# Patient Record
Sex: Male | Born: 1945 | Race: White | Hispanic: No | Marital: Married | Smoking: Former smoker
Health system: Southern US, Community
[De-identification: ages and names within clinical notes are randomized; demographics above are authoritative.]

## PROBLEM LIST (undated history)

## (undated) DIAGNOSIS — E119 Type 2 diabetes mellitus without complications: Secondary | ICD-10-CM

## (undated) DIAGNOSIS — G5603 Carpal tunnel syndrome, bilateral upper limbs: Principal | ICD-10-CM

## (undated) DIAGNOSIS — I10 Essential (primary) hypertension: Secondary | ICD-10-CM

## (undated) DIAGNOSIS — R413 Other amnesia: Secondary | ICD-10-CM

## (undated) DIAGNOSIS — E669 Obesity, unspecified: Secondary | ICD-10-CM

## (undated) DIAGNOSIS — I2510 Atherosclerotic heart disease of native coronary artery without angina pectoris: Secondary | ICD-10-CM

## (undated) DIAGNOSIS — G609 Hereditary and idiopathic neuropathy, unspecified: Secondary | ICD-10-CM

## (undated) DIAGNOSIS — D649 Anemia, unspecified: Secondary | ICD-10-CM

## (undated) DIAGNOSIS — F0280 Dementia in other diseases classified elsewhere without behavioral disturbance: Secondary | ICD-10-CM

## (undated) DIAGNOSIS — K219 Gastro-esophageal reflux disease without esophagitis: Secondary | ICD-10-CM

## (undated) DIAGNOSIS — E1149 Type 2 diabetes mellitus with other diabetic neurological complication: Secondary | ICD-10-CM

## (undated) DIAGNOSIS — G309 Alzheimer's disease, unspecified: Secondary | ICD-10-CM

## (undated) DIAGNOSIS — E538 Deficiency of other specified B group vitamins: Secondary | ICD-10-CM

## (undated) DIAGNOSIS — R209 Unspecified disturbances of skin sensation: Secondary | ICD-10-CM

## (undated) DIAGNOSIS — K635 Polyp of colon: Secondary | ICD-10-CM

## (undated) HISTORY — DX: Unspecified disturbances of skin sensation: R20.9

## (undated) HISTORY — DX: Gastro-esophageal reflux disease without esophagitis: K21.9

## (undated) HISTORY — DX: Type 2 diabetes mellitus without complications: E11.9

## (undated) HISTORY — DX: Carpal tunnel syndrome, bilateral upper limbs: G56.03

## (undated) HISTORY — DX: Alzheimer's disease, unspecified: G30.9

## (undated) HISTORY — DX: Deficiency of other specified B group vitamins: E53.8

## (undated) HISTORY — DX: Type 2 diabetes mellitus with other diabetic neurological complication: E11.49

## (undated) HISTORY — DX: Obesity, unspecified: E66.9

## (undated) HISTORY — DX: Other amnesia: R41.3

## (undated) HISTORY — DX: Dementia in other diseases classified elsewhere without behavioral disturbance: F02.80

## (undated) HISTORY — DX: Hereditary and idiopathic neuropathy, unspecified: G60.9

## (undated) HISTORY — DX: Atherosclerotic heart disease of native coronary artery without angina pectoris: I25.10

## (undated) HISTORY — PX: COLONOSCOPY: SHX174

---

## 2003-07-17 HISTORY — PX: OTHER SURGICAL HISTORY: SHX169

## 2008-01-07 ENCOUNTER — Ambulatory Visit: Payer: Self-pay | Admitting: Thoracic Surgery (Cardiothoracic Vascular Surgery)

## 2008-01-15 ENCOUNTER — Ambulatory Visit: Payer: Self-pay | Admitting: Cardiothoracic Surgery

## 2008-01-22 ENCOUNTER — Ambulatory Visit: Payer: Self-pay | Admitting: Cardiothoracic Surgery

## 2008-01-29 ENCOUNTER — Ambulatory Visit: Payer: Self-pay | Admitting: Thoracic Surgery (Cardiothoracic Vascular Surgery)

## 2008-02-02 ENCOUNTER — Ambulatory Visit: Payer: Self-pay | Admitting: Thoracic Surgery (Cardiothoracic Vascular Surgery)

## 2008-02-05 ENCOUNTER — Ambulatory Visit: Payer: Self-pay | Admitting: Thoracic Surgery (Cardiothoracic Vascular Surgery)

## 2008-02-11 ENCOUNTER — Ambulatory Visit: Payer: Self-pay | Admitting: Thoracic Surgery (Cardiothoracic Vascular Surgery)

## 2008-02-25 ENCOUNTER — Ambulatory Visit: Payer: Self-pay | Admitting: Thoracic Surgery (Cardiothoracic Vascular Surgery)

## 2011-03-13 ENCOUNTER — Other Ambulatory Visit: Payer: Self-pay | Admitting: Orthopedic Surgery

## 2011-03-13 DIAGNOSIS — M5412 Radiculopathy, cervical region: Secondary | ICD-10-CM

## 2011-03-17 ENCOUNTER — Ambulatory Visit
Admission: RE | Admit: 2011-03-17 | Discharge: 2011-03-17 | Disposition: A | Discharge status: Home / Self Care | Payer: Medicare Other | Source: Ambulatory Visit | Attending: Orthopedic Surgery | Admitting: Orthopedic Surgery

## 2011-03-17 DIAGNOSIS — M5412 Radiculopathy, cervical region: Secondary | ICD-10-CM

## 2011-07-17 HISTORY — PX: OTHER SURGICAL HISTORY: SHX169

## 2013-04-20 ENCOUNTER — Encounter: Payer: Self-pay | Admitting: Neurology

## 2013-04-22 ENCOUNTER — Ambulatory Visit (INDEPENDENT_AMBULATORY_CARE_PROVIDER_SITE_OTHER): Payer: Medicare Other | Admitting: Neurology

## 2013-04-22 ENCOUNTER — Encounter: Payer: Self-pay | Admitting: Neurology

## 2013-04-22 VITALS — BP 154/72 | HR 66 | Ht 66.0 in | Wt 194.0 lb

## 2013-04-22 DIAGNOSIS — R6889 Other general symptoms and signs: Secondary | ICD-10-CM

## 2013-04-22 DIAGNOSIS — R413 Other amnesia: Secondary | ICD-10-CM

## 2013-04-22 HISTORY — DX: Other amnesia: R41.3

## 2013-04-22 MED ORDER — DONEPEZIL HCL 5 MG PO TABS: 5.0000 mg | ORAL_TABLET | Freq: Every day | ORAL | Status: DC

## 2013-04-22 NOTE — Patient Instructions (Signed)
With the aricept, look out for nausea, diarrhea, weight loss. Call in 4 weeks if tolerating, and we will go up to 10 mg daily dose(start at 5 mg)

## 2013-04-22 NOTE — Progress Notes (Signed)
Reason for visit: Memory disturbance  Gregory Ortega is a 67 y.o. male  History of present illness:  Gregory Ortega is a 67 year old left-handed white male with a history of a progressive memory disturbance dating back 4 years. The patient was initially placed on a statin drug, simvastatin, for dyslipidemia. The patient began having significant cognitive issues, and he initially improved when he came off of the medication 2 years ago. The patient however, has continued to get worse once again within the last year. The patient did have a CT scan of the brain done in 2011 that was relatively unremarkable. The patient has not had any brain imaging since that time. The patient has had some issues with keeping up with appointments and medications. The patient does not do the finances, and his wife took over this many years ago. The patient denies any headache, or significant balance issues. The patient denies problems with numbness or weakness of the extremities, or problems with control of the bowels or the bladder. The patient does have some mild fatigue associated with his blood pressure medications. Otherwise, the patient sleeps well at night. The patient is sent to this office for an evaluation. The patient has a history of diabetes, but he denies episodes of hypoglycemia. The patient has a history of a vitamin B12 deficiency, but he is now on adequate oral supplementation, recent blood levels were well within the normal range.   Past Medical History  Diagnosis Date  . Type II or unspecified type diabetes mellitus without mention of complication, not stated as uncontrolled   . Coronary atherosclerosis of unspecified type of vessel, native or graft   . Type II or unspecified type diabetes mellitus with neurological manifestations, not stated as uncontrolled(250.60)   . Esophageal reflux   . Obesity, unspecified   . Unspecified hereditary and idiopathic peripheral neuropathy   . Disturbance of skin  sensation   . Other B-complex deficiencies   . Memory difficulty 04/22/2013    Past Surgical History  Procedure Laterality Date  . By-pass surgery  2005  . Moles removal  2013    Family History  Problem Relation Age of Onset  . Hypertension Mother   . Alzheimer's disease Mother   . Dementia Mother   . Cancer Brother   . Heart attack Brother   . Kidney disease Father   . Alzheimer's disease Father   . Rheum arthritis Sister     Social history:  reports that he has quit smoking. He does not have any smokeless tobacco history on file. He reports that he does not drink alcohol or use illicit drugs.  Medications:  Current Outpatient Prescriptions on File Prior to Visit  Medication Sig Dispense Refill  . aspirin 81 MG tablet Take 81 mg by mouth daily.      . carvedilol (COREG) 12.5 MG tablet Take 12.5 mg by mouth 2 (two) times daily with a meal.      . clopidogrel (PLAVIX) 75 MG tablet Take 75 mg by mouth daily.      Marland Kitchen co-enzyme Q-10 50 MG capsule Take 100 mg by mouth 2 (two) times a week.      . docusate sodium (COLACE) 100 MG capsule Take 100 mg by mouth 2 (two) times daily.      . famotidine (PEPCID) 20 MG tablet Take 20 mg by mouth 2 (two) times daily.      . fish oil-omega-3 fatty acids 1000 MG capsule Take 2 g by mouth daily.      Marland Kitchen  magnesium 30 MG tablet Take 30 mg by mouth 2 (two) times daily.      . metFORMIN (GLUCOPHAGE-XR) 500 MG 24 hr tablet Take 1,000 mg by mouth 2 (two) times daily.      . nitroGLYCERIN (NITROSTAT) 0.4 MG SL tablet Place 0.4 mg under the tongue every 5 (five) minutes as needed for chest pain.       No current facility-administered medications on file prior to visit.      Allergies  Allergen Reactions  . Ambien [Zolpidem Tartrate] Other (See Comments)    irritability  . Statins     ROS:  Out of a complete 14 system review of symptoms, the patient complains only of the following symptoms, and all other reviewed systems are  negative.  Moles Shortness of breath, snoring Impotence Easy bruising, easy bleeding Feeling cold Memory loss  Blood pressure 154/72, pulse 66, height 5\' 6"  (1.676 m), weight 194 lb (87.998 kg).  Physical Exam  General: The patient is alert and cooperative at the time of the examination.  Head: Pupils are equal, round, and reactive to light. Discs are flat bilaterally.  Neck: The neck is supple, no carotid bruits are noted.  Respiratory: The respiratory examination is clear.  Cardiovascular: The cardiovascular examination reveals a regular rate and rhythm, no obvious murmurs or rubs are noted.  Skin: Extremities are without significant edema.  Neurologic Exam  Mental status: Mini-Mental status examination done today shows a total score of 15/30.  Cranial nerves: Facial symmetry is present. There is good sensation of the face to pinprick and soft touch bilaterally. The strength of the facial muscles and the muscles to head turning and shoulder shrug are normal bilaterally. Speech is well enunciated, no aphasia or dysarthria is noted. Extraocular movements are full. Visual fields are full.  Motor: The motor testing reveals 5 over 5 strength of all 4 extremities. Good symmetric motor tone is noted throughout.  Sensory: Sensory testing is intact to pinprick, soft touch, vibration sensation, and position sense on all 4 extremities. No evidence of extinction is noted.  Coordination: Cerebellar testing reveals good finger-nose-finger and heel-to-shin bilaterally. apraxia with the use of the lower extremities is seen.  Gait and station: Gait is normal. Tandem gait is normal. Romberg is negative. No drift is seen.  Reflexes: Deep tendon reflexes are symmetric and normal bilaterally. Toes are downgoing bilaterally.   Assessment/Plan:  1. Progressive memory disturbance  2. 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 History of vitamin B12 deficiency  The patient does have a  family history of memory issues, and his mother had dementia. The patient will be sent for further blood work today, and he will have MRI evaluation of the brain. The patient will be placed on low-dose Aricept, and he will contact our office within one month if he is tolerating the drug, and he will be increased to a 10 mg maintenance dose of this medication. The patient will followup in 6 months. The statin drugs are not likely the sole etiology of his cognitive issues. 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  Marlan Palau MD 04/22/2013 7:37 PM  [facility] [address] [place], [place] [address]  Phone [phone] Fax [phone]

## 2013-04-23 LAB — RPR: RPR: NONREACTIVE

## 2013-04-23 NOTE — Progress Notes (Signed)
Quick Note:  Spoke to patient's wife and relayed unremarkable blood work results, per Dr. Anne Hahn. ______

## 2013-05-08 ENCOUNTER — Ambulatory Visit
Admission: RE | Admit: 2013-05-08 | Discharge: 2013-05-08 | Disposition: A | Discharge status: Home / Self Care | Payer: Medicare Other | Source: Ambulatory Visit | Attending: Neurology | Admitting: Neurology

## 2013-05-08 DIAGNOSIS — R413 Other amnesia: Secondary | ICD-10-CM

## 2013-05-09 ENCOUNTER — Telehealth: Payer: Self-pay | Admitting: Neurology

## 2013-05-09 NOTE — Telephone Encounter (Signed)
I called patient. MRI the brain shows minimal small vessel disease. The patient is on low-dose aspirin, he has several risk factors for stroke. The patient will followup in about 6 months.

## 2013-06-21 ENCOUNTER — Other Ambulatory Visit: Payer: Self-pay

## 2013-06-21 MED ORDER — DONEPEZIL HCL 10 MG PO TABS: 10.0000 mg | ORAL_TABLET | Freq: Every day | ORAL | Status: DC

## 2013-06-23 ENCOUNTER — Telehealth: Payer: Self-pay | Admitting: Neurology

## 2013-06-23 NOTE — Telephone Encounter (Signed)
I spoke with spouse.  She said patient has tolerated 5 mg well.  Says she knows at last OV it was mentioned he could go to 10 mg if tolerated.  She said she would like to wait on the increase until after the holidays incase there are any issues.  A Rx for 10 mg has been sent to the pharmacy for them to fill when needed.  She said patient has tablets at this time and they will call back if there are any issues.

## 2013-07-03 ENCOUNTER — Telehealth: Payer: Self-pay | Admitting: *Deleted

## 2013-07-03 NOTE — Telephone Encounter (Signed)
I called patient. The patient was recently increased on Aricept taking 10 mg at night. He was tolerating the 5 mg fairly well. We will have him cut back to 5 mg at night instead.

## 2013-10-21 ENCOUNTER — Ambulatory Visit: Payer: Medicare Other | Admitting: Nurse Practitioner

## 2013-11-11 ENCOUNTER — Ambulatory Visit (INDEPENDENT_AMBULATORY_CARE_PROVIDER_SITE_OTHER): Payer: Medicare Other | Admitting: Neurology

## 2013-11-11 ENCOUNTER — Encounter: Payer: Self-pay | Admitting: Neurology

## 2013-11-11 ENCOUNTER — Encounter (INDEPENDENT_AMBULATORY_CARE_PROVIDER_SITE_OTHER): Payer: Self-pay

## 2013-11-11 VITALS — BP 156/76 | HR 61 | Wt 201.0 lb

## 2013-11-11 DIAGNOSIS — R413 Other amnesia: Secondary | ICD-10-CM

## 2013-11-11 MED ORDER — MEMANTINE HCL 28 X 5 MG & 21 X 10 MG PO TABS: ORAL_TABLET | ORAL | Status: DC

## 2013-11-11 NOTE — Patient Instructions (Addendum)
Dementia Dementia is a word that is used to describe problems with the brain and how it works. People with dementia have memory loss. They may also have problems with thinking, speaking, or solving problems. It can affect how they act around people, how they do their job, their mood, and their personality. These changes may not show up for a long time. Family or friends may not notice problems in the early part of this disease. HOME CARE The following tips are for the person living with, or caring for, the person with dementia. Make the home safe.  Remove locks on bathroom doors.  Use childproof locks on cabinets where alcohol, cleaning supplies, or chemicals are stored.  Put outlet covers in electrical outlets.  Put in childproof locks to keep doors and windows safe.  Remove stove knobs, or put in safety knobs that shut off on their own.  Lower the temperature on water heaters.  Label medicines. Lock them in a safe place.  Keep knives, lighters, matches, power tools, and guns out of reach or in a safe place.  Remove objects that might break or can hurt the person.  Make sure lighting is good inside and outside.  Put in grab bars if needed.  Use a device that detects falls or other needs for help. Lessen confusion.  Keep familiar objects and people around.  Use night lights or low lit (dim) lights at night.  Label objects or areas.  Use reminders, notes, or directions for daily activities or tasks.  Keep a simple routine that is the same for waking, meals, bathing, dressing, and bedtime.  Create a calm and quiet home.  Put up clocks and calendars.  Keep emergency numbers and the home address near all phones.  Help show the different times of day. Open the curtains during the day to let light in. Speak clearly and directly.  Choose simple words and short sentences.  Use a gentle, calm voice.  Do not interrupt.  If the person has a hard time finding a word to  use, give them the word or thought.  Ask 1 question at a time. Give enough time for the person to answer. Repeat the question if the person does not answer. Do things that lessen restlessness.  Provide a comfortable bed.  Have the same bedtime routine every night.  Have a regular walking and activity schedule.  Lessen naps during the day.  Do not let the person drink a lot of caffeine.  Go to events that are not overwhelming. Eat well and drink fluids.  Lessen distractions during meal times and snacks.  Avoid foods that are too hot or too cold.  Watch how the person chews and swallows. This is to make sure they do not choke. Other  Keep all vision, hearing, dental, and medical visits with the doctor.  Only give medicines as told by the doctor.  Watch the person's driving ability. Do not let the person drive if he or she cannot drive safely.  Use a program that helps find a person if they become missing. You may need to register with this program. GET HELP RIGHT AWAY IF:   A fever of 102 F (38.9 C) develops.  Confusion develops or gets worse.  Sleepiness develops or gets worse.  Staying awake is hard to do.  New behavior problems start like mood swings, aggression, and seeing things that are not there.  Problems with balance, speech, or falling develop.  Problems swallowing develop.  Any  problems of another sickness develop. MAKE SURE YOU:  Understand these instructions.  Will watch his or her condition.  Will get help right away if he or she is not doing well or gets worse. Document Released: 06/14/2008 Document Revised: 09/24/2011 Document Reviewed: 11/27/2010 [facility] Patient Information 2014 [employer], [place]. Memantine Tablets What is this medicine? MEMANTINE (MEM an teen) is used to treat dementia caused by Alzheimer's disease. This medicine may be used for other purposes; ask your health care provider or pharmacist if you have questions. COMMON  BRAND NAME(S): Namenda What should I tell my health care provider before I take this medicine? They need to know if you have any of these conditions: -difficulty passing urine -kidney disease -liver disease -seizures -an unusual or allergic reaction to memantine, other medicines, foods, dyes, or preservatives -pregnant or trying to get pregnant -breast-feeding How should I use this medicine? Take this medicine by mouth with a glass of water. Follow the directions on the prescription label. You may take this medicine with or without food. Take your doses at regular intervals. Do not take your medicine more often than directed. Continue to take your medicine even if you feel better. Do not stop taking except on the advice of your doctor or health care professional. Talk to your pediatrician regarding the use of this medicine in children. Special care may be needed. Overdosage: If you think you have taken too much of this medicine contact a poison control center or emergency room at once. NOTE: This medicine is only for you. Do not share this medicine with others. What if I miss a dose? If you miss a dose, take it as soon as you can. If it is almost time for your next dose, take only that dose. Do not take double or extra doses. If you do not take your medicine for several days, contact your health care provider. Your dose may need to be changed. What may interact with this medicine? -acetazolamide -amantadine -cimetidine -dextromethorphan -dofetilide -hydrochlorothiazide -ketamine -metformin -methazolamide -quinidine -ranitidine -sodium bicarbonate -triamterene This list may not describe all possible interactions. Give your health care provider a list of all the medicines, herbs, non-prescription drugs, or dietary supplements you use. Also tell them if you smoke, drink alcohol, or use illegal drugs. Some items may interact with your medicine. What should I watch for while using this  medicine? Visit your doctor or health care professional for regular checks on your progress. Check with your doctor or health care professional if there is no improvement in your symptoms or if they get worse. You may get drowsy or dizzy. Do not drive, use machinery, or do anything that needs mental alertness until you know how this drug affects you. Do not stand or sit up quickly, especially if you are an older patient. This reduces the risk of dizzy or fainting spells. Alcohol can make you more drowsy and dizzy. Avoid alcoholic drinks. What side effects may I notice from receiving this medicine? Side effects that you should report to your doctor or health care professional as soon as possible: -allergic reactions like skin rash, itching or hives, swelling of the face, lips, or tongue -agitation or a feeling of restlessness -depressed mood -dizziness -hallucinations -redness, blistering, peeling or loosening of the skin, including inside the mouth -seizures -vomiting Side effects that usually do not require medical attention (report to your doctor or health care professional if they continue or are bothersome): -constipation -diarrhea -headache -nausea -trouble sleeping This list may not  describe all possible side effects. Call your doctor for medical advice about side effects. You may report side effects to FDA at 1-800-FDA-1088. Where should I keep my medicine? Keep out of the reach of children. Store at room temperature between 15 degrees and 30 degrees C (59 degrees and 86 degrees F). Throw away any unused medicine after the expiration date. NOTE: This sheet is a summary. It may not cover all possible information. If you have questions about this medicine, talk to your doctor, pharmacist, or health care provider.  2014, Elsevier/Gold Standard. (2013-04-20 14:10:42)

## 2013-11-11 NOTE — Progress Notes (Signed)
PATIENT: Gregory Ortega DOB: 1946-03-01  REASON FOR VISIT: follow up HISTORY FROM: patient  HISTORY OF PRESENT ILLNESS: Gregory Ortega is a 68 year old left-handed white male with a history of a progressive memory disturbance. He returns today for follow-up. At the last visit he was started on Aricept 5mg  and was increased to 10mg  one month later but never took the 10 mg only took 5mg . Wife states that he became verbally aggressive. States he raised his voice with her and got in her face, however he has never hurt her. They stopped the Aricept and the aggressive behavior resolved. The wife feels like he has gotten worse within the last 2 weeks. The patient was started on two new medications Ranexa and Zetia. Wife notices that the change in his cognition happened after he started these medications. The patient feels that some days his memory is good and other times it is bad. He continues to operate a motor vehicle, has had some issues with knowing whether or not to go or stop. The patient continues to mow the yard, eat out and go to church. Wife states that he carries on conversations with people very well. He will not remember what his wife said two minutes prior or will forget what objects are used for. When dressing sometimes he will put his shirt on backwards or can't tie his tie. When signings checks he will forget how to sign his name.   REVIEW OF SYSTEMS: Full 14 system review of systems performed and notable only for:  Constitutional: N/A  Eyes: N/A Ear/Nose/Throat: N/A  Skin: moles  Cardiovascular: murmur  Respiratory: cough, shortness of breath Gastrointestinal: N/A  Genitourinary: N/A Hematology/Lymphatic: bruise/bleed easily Endocrine: cold intolerance Musculoskeletal:N/A  Allergy/Immunology: N/A  Neurological: memory loss Psychiatric: confusion  Sleep: N/A   ALLERGIES: Allergies  Allergen Reactions  . Ambien [Zolpidem Tartrate] Other (See Comments)    irritability  .  Statins     HOME MEDICATIONS: Outpatient Prescriptions Prior to Visit  Medication Sig Dispense Refill  . ALPHA LIPOIC ACID PO Take 1 mg by mouth daily.      Marland Kitchen aspirin 81 MG tablet Take 81 mg by mouth daily.      . carvedilol (COREG) 12.5 MG tablet Take 12.5 mg by mouth 2 (two) times daily with a meal.      . Chromium 200 MCG TABS Take 200 mcg by mouth daily.      . clopidogrel (PLAVIX) 75 MG tablet Take 75 mg by mouth daily.      Marland Kitchen co-enzyme Q-10 50 MG capsule Take 100 mg by mouth 2 (two) times a week.      . Cyanocobalamin (HM SUPER VITAMIN B12) 2500 MCG CHEW Chew 2,500 mcg by mouth daily.      Marland Kitchen docusate sodium (COLACE) 100 MG capsule Take 100 mg by mouth 2 (two) times daily.      Marland Kitchen donepezil (ARICEPT) 10 MG tablet Take 1 tablet (10 mg total) by mouth at bedtime.  30 tablet  4  . famotidine (PEPCID) 20 MG tablet Take 20 mg by mouth 2 (two) times daily.      . fish oil-omega-3 fatty acids 1000 MG capsule Take 2 g by mouth daily.      . magnesium 30 MG tablet Take 30 mg by mouth 2 (two) times daily.      . metFORMIN (GLUCOPHAGE-XR) 500 MG 24 hr tablet Take 1,000 mg by mouth 2 (two) times daily.      . Multiple  Vitamins-Minerals (MULTIVITAMIN WITH MINERALS) tablet Take 1 tablet by mouth daily.      . nitroGLYCERIN (NITROSTAT) 0.4 MG SL tablet Place 0.4 mg under the tongue every 5 (five) minutes as needed for chest pain.      . vitamin E 200 UNIT capsule Take 200 Units by mouth daily.       No facility-administered medications prior to visit.    PAST MEDICAL HISTORY: Past Medical History  Diagnosis Date  . Type II or unspecified type diabetes mellitus without mention of complication, not stated as uncontrolled   . Coronary atherosclerosis of unspecified type of vessel, native or graft   . Type II or unspecified type diabetes mellitus with neurological manifestations, not stated as uncontrolled   . Esophageal reflux   . Obesity, unspecified   . Unspecified hereditary and idiopathic  peripheral neuropathy   . Disturbance of skin sensation   . Other B-complex deficiencies   . Memory difficulty 04/22/2013    PAST SURGICAL HISTORY: Past Surgical History  Procedure Laterality Date  . By-pass surgery  2005  . Moles removal  2013    FAMILY HISTORY: Family History  Problem Relation Age of Onset  . Hypertension Mother   . Alzheimer's disease Mother   . Dementia Mother   . Cancer Brother   . Heart attack Brother   . Kidney disease Father   . Alzheimer's disease Father   . Rheum arthritis Sister     SOCIAL HISTORY: History   Social History  . Marital Status: Married    Spouse Name: N/A    Number of Children: N/A  . Years of Education: N/A   Occupational History  . Retired    Social History Main Topics  . Smoking status: Former Games developermoker  . Smokeless tobacco: Not on file  . Alcohol Use: No  . Drug Use: No  . Sexual Activity: Not on file   Other Topics Concern  . Not on file   Social History Narrative  . No narrative on file      PHYSICAL EXAM  There were no vitals filed for this visit. There is no weight on file to calculate BMI.  Generalized: Well developed, in no acute distress    Neurological examination  Mentation: Alert oriented to time, place, history taking. Follows all commands speech and language fluent. MMSE 11/30 Cranial nerve II-XII:t extraocular movements were full, visual field were full on confrontational test.  Motor: The motor testing reveals 5 over 5 strength of all 4 extremities. Good symmetric motor tone is noted throughout.  Sensory: Sensory testing is intact to soft touch on all 4 extremities. No evidence of extinction is noted.  Coordination: Cerebellar testing reveals good finger-nose-finger and heel-to-shin bilaterally.  Gait and station: Gait is normal. Tandem gait is normal. Romberg is negative. No drift is seen.  Reflexes: Deep tendon reflexes are symmetric and normal bilaterally.   DIAGNOSTIC DATA (LABS,  IMAGING, TESTING) - I reviewed patient records, labs, notes, testing and imaging myself where available.  Lab Results  Component Value Date   TSH 1.170 04/22/2013      ASSESSMENT AND PLAN 68 y.o. year old male  has a past medical history of Type II or unspecified type diabetes mellitus without mention of complication, not stated as uncontrolled; Coronary atherosclerosis of unspecified type of vessel, native or graft; Type II or unspecified type diabetes mellitus with neurological manifestations, not stated as uncontrolled; Esophageal reflux; Obesity, unspecified; Unspecified hereditary and idiopathic peripheral neuropathy; Disturbance of skin  sensation; Other B-complex deficiencies; and Memory difficulty (04/22/2013). here with:   1. Memory difficulty  The patient's memory has declined over the last two weeks. MMSE 11/30 this visit and 15/30 last visit. The patient is advised to speak with his PCP about discontinuing the use of ranexa since confusion is a side effect of this medication. We encouraged the patient to not operate a motor vehicle. The patient was not able to tolerate Aricept. We will start the patient on Namenda. Follow-up in 6 months or sooner if needed.    Gregory PennyMegan Kensy Blizard, MSN, NP-C 11/11/2013, 3:35 PM [facility] [address], [address] [place], [place] [address] [phone]  Note: This document was prepared with digital dictation and possible smart phrase technology. Any transcriptional errors that result from this process are unintentional. Gregory Ortega

## 2013-11-12 ENCOUNTER — Telehealth: Payer: Self-pay | Admitting: *Deleted

## 2013-11-12 NOTE — Telephone Encounter (Signed)
Patient's wife calling wanting to know if they could get samples of memantine (NAMENDA TITRATION PAK) tablet pack. Please call and advise.  Thanks

## 2013-11-12 NOTE — Telephone Encounter (Signed)
An Rx was sent to the pharmacy yesterday.  I spoke with the pharmacist at [facility], she said they did not have the med in stock, but it did arrive today.  She will call the patient to advise.

## 2013-11-24 ENCOUNTER — Telehealth: Payer: Self-pay | Admitting: Neurology

## 2013-11-24 NOTE — Telephone Encounter (Signed)
I spoke with MS Kyung RuddKennedy.  She said the patient was taking Namenda 5mg  once daily and seemed to be "more like himself".  Says he started taking 5mg  twice daily yesterday, and now seems to be getting confused again.  For example, he could not remember which key belonged to the house and which key belonged to the car.  She would like to know what they should do.  Please advise.  Thank you.

## 2013-11-24 NOTE — Telephone Encounter (Signed)
Pt's wife Dewayne Hatchnn called states pt is having problems with the medication memantine (NAMENDA TITRATION PAK) tablet pack, states pt is getting confused but did ok on the lower dosage he did better than when he started taking the medication at night time. Please call Ann concerning this matter. Thanks

## 2013-11-24 NOTE — Telephone Encounter (Signed)
I called the patient, talked with the wife. The patient began Namenda 5 mg twice daily today, and has had some increased confusion. I will try this for another several days, but if the confusion continues, they are to stop the medication. We may try Axona or other related products if this is the case.

## 2013-12-09 ENCOUNTER — Telehealth: Payer: Self-pay | Admitting: Neurology

## 2013-12-09 MED ORDER — MEMANTINE HCL 10 MG PO TABS: 10.0000 mg | ORAL_TABLET | Freq: Two times a day (BID) | ORAL | Status: DC

## 2013-12-09 NOTE — Telephone Encounter (Signed)
Patients wife returning Jessicas call.  States patient is doing well on this medication now.  He is taking 10 MG twice per day.

## 2013-12-09 NOTE — Telephone Encounter (Signed)
Rx has been sent.  Called back.  Got no answer.  Left message.

## 2013-12-09 NOTE — Telephone Encounter (Signed)
Per previous note, the patient had some issues with confusion while on this medication.  I called back to see how he was doing on this drug, and verify what dose he is currently taking.  Got no answer.  Left message.

## 2013-12-09 NOTE — Telephone Encounter (Signed)
Patient's sps called requesting rx for memantine (NAMENDA TITRATION PAK) tablet pack 10 mg.  Please call and advise

## 2014-04-09 ENCOUNTER — Telehealth: Payer: Self-pay | Admitting: Neurology

## 2014-04-09 MED ORDER — MEMANTINE HCL ER 28 MG PO CP24: 28.0000 mg | ORAL_CAPSULE | Freq: Every day | ORAL | Status: DC

## 2014-04-09 NOTE — Addendum Note (Signed)
Addended by: Stephanie Acre on: 04/09/2014 05:35 PM   Modules accepted: Orders

## 2014-04-09 NOTE — Telephone Encounter (Signed)
I called and talked with the wife. [facility] is doing a study involving a NS, I encouraged her to learn more about the study.

## 2014-04-09 NOTE — Telephone Encounter (Signed)
Patient's wife Gregory Ortega calling with questions about [facility] research on Alzheimer's, if Dr Anne Hahn knows anything about that, please return call and advise.

## 2014-04-09 NOTE — Telephone Encounter (Signed)
See phone note, patient aware Dr. Jaye Beagle of office.

## 2014-04-09 NOTE — Telephone Encounter (Signed)
Informed patient spouse that Dr. Anne Hahn will be out of office until Tuesday. He will call her back to respond to her question.

## 2014-05-12 ENCOUNTER — Ambulatory Visit (INDEPENDENT_AMBULATORY_CARE_PROVIDER_SITE_OTHER): Payer: Medicare Other | Admitting: Neurology

## 2014-05-12 ENCOUNTER — Encounter: Payer: Self-pay | Admitting: Neurology

## 2014-05-12 VITALS — BP 119/72 | HR 60 | Ht 67.0 in | Wt 208.0 lb

## 2014-05-12 DIAGNOSIS — R413 Other amnesia: Secondary | ICD-10-CM

## 2014-05-12 MED ORDER — MEMANTINE HCL ER 28 MG PO CP24: 28.0000 mg | ORAL_CAPSULE | Freq: Every day | ORAL | Status: DC

## 2014-05-12 NOTE — Progress Notes (Signed)
Reason for visit: Dementia  Kathrin RuddyVirgil Aro is an 68 y.o. male  History of present illness:  Mr. Kyung RuddKennedy is a 68 year old left-handed white male with a history of Alzheimer's disease. The patient has had problems with aggression on Aricept, and this medication had to be stopped. The patient has done quite well on Namenda, currently on the 28 mg extended-release capsule. He is also taking coconut oil, and he is tolerating this well. He apparently is back to driving a car on occasion, and he apparently has not had any problems with safety issues. The patient has not had any new medical issues that have come up since last seen. The patient has been interested in being involved with research, but he did not qualify [facility] a study at [facility].  Past Medical History  Diagnosis Date  . Type II or unspecified type diabetes mellitus without mention of complication, not stated as uncontrolled   . Coronary atherosclerosis of unspecified type of vessel, native or graft   . Type II or unspecified type diabetes mellitus with neurological manifestations, not stated as uncontrolled   . Esophageal reflux   . Obesity, unspecified   . Unspecified hereditary and idiopathic peripheral neuropathy   . Disturbance of skin sensation   . Other B-complex deficiencies   . Memory difficulty 04/22/2013    Past Surgical History  Procedure Laterality Date  . By-pass surgery  2005  . Moles removal  2013    Family History  Problem Relation Age of Onset  . Hypertension Mother   . Alzheimer's disease Mother   . Dementia Mother   . Cancer Brother   . Heart attack Brother   . Kidney disease Father   . Alzheimer's disease Father   . Rheum arthritis Sister     Social history:  reports that he has quit smoking. He does not have any smokeless tobacco history on file. He reports that he does not drink alcohol or use illicit drugs.    Allergies  Allergen Reactions  . Ambien [Zolpidem Tartrate] Other (See Comments)   irritability  . Statins     Medications:  Current Outpatient Prescriptions on File Prior to Visit  Medication Sig Dispense Refill  . aspirin 81 MG tablet Take 81 mg by mouth daily.      . clopidogrel (PLAVIX) 75 MG tablet Take 75 mg by mouth daily.      Marland Kitchen. co-enzyme Q-10 50 MG capsule Take 100 mg by mouth 2 (two) times a week.      . Cyanocobalamin (HM SUPER VITAMIN B12) 2500 MCG CHEW Chew 2,500 mcg by mouth daily.      Marland Kitchen. docusate sodium (COLACE) 100 MG capsule Take 100 mg by mouth 2 (two) times daily.      . famotidine (PEPCID) 20 MG tablet Take 20 mg by mouth 2 (two) times daily.      . fish oil-omega-3 fatty acids 1000 MG capsule Take 2 g by mouth daily.      . metFORMIN (GLUCOPHAGE-XR) 500 MG 24 hr tablet Take 1,000 mg by mouth 2 (two) times daily.      . Multiple Vitamins-Minerals (MULTIVITAMIN WITH MINERALS) tablet Take 1 tablet by mouth daily.      . nitroGLYCERIN (NITROSTAT) 0.4 MG SL tablet Place 0.4 mg under the tongue every 5 (five) minutes as needed for chest pain.      . vitamin E 200 UNIT capsule Take 200 Units by mouth daily.       No current  facility-administered medications on file prior to visit.    ROS:  Out of a complete 14 system review of symptoms, the patient complains only of the following symptoms, and all other reviewed systems are negative.  Cough Bruising easily Memory loss Decreased concentration Moles  Blood pressure 119/72, pulse 60, height 5\' 7"  (1.702 m), weight 208 lb (94.348 kg).  Physical Exam  General: The patient is alert and cooperative at the time of the examination. The patient is moderately obese.  Skin: No significant peripheral edema is noted.   Neurologic Exam  Mental status: The Mini-Mental Status Examination done today shows a total score of 16/30.  Cranial nerves: Facial symmetry is present. Speech is normal, no aphasia or dysarthria is noted. Extraocular movements are full. Visual fields are full.  Motor: The patient has  good strength in all 4 extremities.  Sensory examination: Soft touch sensation is symmetric on the face, arms, and legs.  Coordination: The patient has good finger-nose-finger and heel-to-shin bilaterally. 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  Gait and station: The patient has a normal gait. Tandem gait is normal. Romberg is negative. No drift is seen.  Reflexes: Deep tendon reflexes are symmetric.   Assessment/Plan:  1. Progressive dementia, Alzheimer's disease  The patient has done better with the Namenda, and apparently the wife is now letting him drive again. I have recommended that he not operate a motor vehicle. The patient is to continue the [place], he will follow-up in about 6 months. We will be involved with an Alzheimer's research study beginning in January, 2016. I have given the name of this patient to our research coordinator.  Marlan Palau. Keith Willis MD 05/12/2014 7:17 PM  [facility] [address] [place], [place] [id number]  Phone [phone] Fax [phone]

## 2014-05-12 NOTE — Patient Instructions (Signed)
Alzheimer Disease Alzheimer disease is a mental disorder. It causes memory loss and loss of other mental functions, such as learning, thinking, problem solving, communicating, and completing tasks. The mental losses interfere with the ability to perform daily activities at work, at home, or in social situations. Alzheimer disease usually starts in a person's late 60s or early 70s but can start earlier in life (familial form). The mental changes caused by this disease are permanent and worsen over time. As the illness progresses, the ability to do even the simplest things is lost. Survival with Alzheimer disease ranges from several years to as long as 20 years. CAUSES Alzheimer disease is caused by abnormally high levels of a protein (beta-amyloid) in the brain. This protein forms very small deposits within and around the brain's nerve cells. These deposits prevent the nerve cells from working properly. Experts are not certain what causes the beta-amyloid deposits in this disease. RISK FACTORS The following major risk factors have been identified:  Increasing age.  Certain genetic variations, such as Down syndrome (trisomy 21). SYMPTOMS In the early stages of Alzheimer disease, you are still able to perform daily activities but need greater effort, more time, or memory aids. Early symptoms include:  Mild memory loss of recent events, names, or phone numbers.  Loss of objects.  Minor loss of vocabulary.  Difficulty with complex tasks, such as paying bills or driving in unfamiliar locations. Other mental functions deteriorate as the disease worsens. These changes slowly go from mild to severe. Symptoms at this stage include:  Difficulty remembering. You may not be able to recall personal information such as your address and telephone number. You may become confused about the date, the season of the year, or your location.  Difficulty maintaining attention. You may forget what you wanted to say  during conversations and repeat what you have already said.  Difficulty learning new information or tasks. You may not remember what you read or the name of a new friend you met.  Difficulty counting or doing math. You may have difficulty with complex math problems. You may make mistakes in paying bills or managing your checkbook.  Poor reasoning and judgment. You may make poor decisions or not dress right for the weather.  Difficulty communicating. You may have regular difficulty remembering words, naming objects, expressing yourself clearly, or writing sentences that make sense.  Difficulty performing familiar daily activities. You may get lost driving in familiar locations or need help eating, bathing, dressing, grooming, or using the toilet. You may have difficulty maintaining bladder or bowel control.  Difficulty recognizing familiar faces. You may confuse family members or close friends with one another. You may not recognize a close relative or may mistake strangers for family. Alzheimer disease also may cause changes in personality and behavior. These changes include:   Loss of interest or motivation.  Social withdrawal.  Anxiety.  Difficulty sleeping.  Uncharacteristic anger or combativeness.  A false belief that someone is trying to harm you (paranoia).  Seeing things that are not real (hallucinations).  Agitation. Confusion and disruptive behavior are often worse at night and may be triggered by changes in the environment or acute medical issues. DIAGNOSIS  Alzheimer disease is diagnosed through an assessment by your health care provider. During this assessment, your health care provider will do the following:  Ask you and your family, friends, or caregivers questions about your symptoms, their frequency, their duration and progression, and the effect they are having on your life.    Ask questions about your personal and family medical history and use of alcohol or drugs,  including prescription medicine.  Perform a physical exam and order blood tests and brain imaging exams. Your health care provider may refer you to a specialist for detailed evaluation of your mental functions (neuropsychological testing).  Many different brain disorders, medical conditions, and certain substances can cause symptoms that resemble Alzheimer disease symptoms. These must be ruled out before this disease can be diagnosed. If Alzheimer disease is diagnosed, it will be considered either "possible" or "probable" Alzheimer disease. "Possible" Alzheimer disease means that your symptoms are typical of the disease and no other disorder is causing them. "Probable" Alzheimer disease means that you also have a family history of the disease or genetic test results that support the diagnosis. Certain tests, mostly used in research studies, are highly specific for Alzheimer disease.  TREATMENT  There is currently no cure for this disease. The goals of treatment are to:  Slow down the progression of the disease.  Preserve mental function as long as possible.  Manage behavioral symptoms.  Make life easier for the person with Alzheimer disease and his or her caregivers. The following treatment options are available:  Medicine. Certain medicines may help slow memory loss by changing the level of certain chemicals in the brain. Medicine may also help with behavioral symptoms.  Talk therapy. Talk therapy provides education, support, and memory aids for people with this disease. It is most effective in the early stages of the illness.  Caregiving. Caregivers may be family members, friends, or trained medical professionals. They help the person with Alzheimer disease with daily life activities. Caregiving may take place at home or at a nursing facility.  Family support groups. These provide education, emotional support, and information about community resources to family members who are taking care of  the person with this disease. Document Released: 03/13/2004 Document Revised: 11/16/2013 Document Reviewed: 11/07/2012 ExitCare Patient Information 2015 [employer]. This information is not intended to replace advice given to you by your health care provider. Make sure you discuss any questions you have with your health care provider.  

## 2014-06-02 ENCOUNTER — Encounter: Payer: Self-pay | Admitting: Neurology

## 2014-06-08 ENCOUNTER — Encounter: Payer: Self-pay | Admitting: Neurology

## 2014-11-11 ENCOUNTER — Encounter: Payer: Self-pay | Admitting: Neurology

## 2014-11-11 ENCOUNTER — Ambulatory Visit (INDEPENDENT_AMBULATORY_CARE_PROVIDER_SITE_OTHER): Payer: Medicare Other | Admitting: Neurology

## 2014-11-11 VITALS — BP 152/79 | HR 59 | Ht 66.0 in | Wt 206.0 lb

## 2014-11-11 DIAGNOSIS — G309 Alzheimer's disease, unspecified: Secondary | ICD-10-CM

## 2014-11-11 DIAGNOSIS — F0280 Dementia in other diseases classified elsewhere without behavioral disturbance: Secondary | ICD-10-CM

## 2014-11-11 DIAGNOSIS — R413 Other amnesia: Secondary | ICD-10-CM

## 2014-11-11 HISTORY — DX: Dementia in other diseases classified elsewhere, unspecified severity, without behavioral disturbance, psychotic disturbance, mood disturbance, and anxiety: F02.80

## 2014-11-11 MED ORDER — MEMANTINE HCL 10 MG PO TABS: 10.0000 mg | ORAL_TABLET | Freq: Two times a day (BID) | ORAL | Status: DC

## 2014-11-11 NOTE — Progress Notes (Signed)
Reason for visit: Memory disorder  Gregory Ortega is an 69 y.o. Ortega  History of present illness:  Gregory Ortega with a history of a progressive memory disorder. The patient has gotten somewhat worse with his short-term memory, his wife indicates that he is not able to retain information for more than just a few seconds, he will repeat himself quite frequently, asking the same questions again and again. He is still operates a motor vehicle short distances, we have asked him in the past not to drive a car. The patient has not had any other new medical issues that have come up since last seen. He is on Namenda taking 10 mg twice daily, he is tolerating the medication well. He could not tolerate Aricept. He returns for an evaluation.  Past Medical History  Diagnosis Date  . Type II or unspecified type diabetes mellitus without mention of complication, not stated as uncontrolled   . Coronary atherosclerosis of unspecified type of vessel, native or graft   . Type II or unspecified type diabetes mellitus with neurological manifestations, not stated as uncontrolled   . Esophageal reflux   . Obesity, unspecified   . Unspecified hereditary and idiopathic peripheral neuropathy   . Disturbance of skin sensation   . Other B-complex deficiencies   . Memory difficulty 04/22/2013  . Alzheimer's disease 11/11/2014    Past Surgical History  Procedure Laterality Date  . By-pass surgery  2005  . Moles removal  2013    Family History  Problem Relation Age of Onset  . Hypertension Mother   . Alzheimer's disease Mother   . Dementia Mother   . Cancer Brother   . Heart attack Brother   . Kidney disease Father   . Alzheimer's disease Father   . Rheum arthritis Sister     Social history:  reports that he has quit smoking. He does not have any smokeless tobacco history on file. He reports that he does not drink alcohol or use illicit drugs.    Allergies    Allergen Reactions  . Ambien [Zolpidem Tartrate] Other (See Comments)    irritability  . Statins     Medications:  Prior to Admission medications   Medication Sig Start Date End Date Taking? Authorizing Provider  aspirin 81 MG tablet Take 81 mg by mouth daily.   Yes Historical Provider, MD  carvedilol (COREG) 6.25 MG tablet Take 6.25 mg by mouth 2 (two) times daily with a meal.   Yes Historical Provider, MD  clopidogrel (PLAVIX) 75 MG tablet Take 75 mg by mouth daily.   Yes Historical Provider, MD  co-enzyme Q-10 50 MG capsule Take 100 mg by mouth daily.    Yes Historical Provider, MD  Cyanocobalamin 2500 MCG TABS Take 1 tablet by mouth daily.   Yes Historical Provider, MD  docusate sodium (COLACE) 100 MG capsule Take 100 mg by mouth 2 (two) times daily.   Yes Historical Provider, MD  ezetimibe (ZETIA) 10 MG tablet Take 10 mg by mouth daily.   Yes Historical Provider, MD  famotidine (PEPCID) 20 MG tablet Take 20 mg by mouth 2 (two) times daily.   Yes Historical Provider, MD  fish oil-omega-3 fatty acids 1000 MG capsule Take 2 g by mouth daily.   Yes Historical Provider, MD  memantine (NAMENDA) 10 MG tablet Take 10 mg by mouth 2 (two) times daily.   Yes Historical Provider, MD  metFORMIN (GLUCOPHAGE-XR) 500 MG 24 hr tablet  Take 1,000 mg by mouth 2 (two) times daily.   Yes Historical Provider, MD  Multiple Vitamins-Minerals (MULTIVITAMIN WITH MINERALS) tablet Take 1 tablet by mouth daily.   Yes Historical Provider, MD  nitroGLYCERIN (NITROSTAT) 0.4 MG SL tablet Place 0.4 mg under the tongue every 5 (five) minutes as needed for chest pain.   Yes Historical Provider, MD  vitamin E 200 UNIT capsule Take 200 Units by mouth daily.   Yes Historical Provider, MD    ROS:  Out of a complete 14 system review of symptoms, the patient complains only of the following symptoms, and all other reviewed systems are negative.  Decreased activity Cough, shortness of breath Chest tightness Memory  loss  Blood pressure 152/79, pulse 59, height  (1.676 m), weight 206 lb (93.441 kg).  Physical Exam  General: The patient is alert and cooperative at the time of the examination.  Skin: No significant peripheral edema is noted.   Neurologic Exam  Mental status: The patient is alert and oriented x 2 at the time of the examination (not oriented to date). The patient has apparent normal recent and remote memory, with an apparently normal attention span and concentration ability.   Cranial nerves: Facial symmetry is present. Speech is normal, no aphasia or dysarthria is noted. Extraocular movements are full. Visual fields are full.  Motor: The patient has good strength in all 4 extremities.  Sensory examination: Soft touch sensation is symmetric on the face, arms, and legs.  Coordination: The patient has good finger-nose-finger bilaterally. The patient has significant apraxia with the use of the lower extremities.  Gait and station: The patient has a normal gait. Tandem gait is normal. Romberg is negative. No drift is seen.  Reflexes: Deep tendon reflexes are symmetric.   MRI brain 05/08/13:  IMPRESSION:  Mildly abnormal MRI brain (without) demonstrating: 1. Mild chronic small vessel ischemic disease. 2. No acute findings.    Assessment/Plan:  1. Progressive memory disturbance, Alzheimer's disease  The patient once again was told not to operate a motor vehicle. He will continue on the Namenda, they should be getting the generic form of the drug. The patient will follow-up in about 6 months. The patient indicates that he is interested in considering research, I will refer his name to our research coordinator.  Marlan Palau MD 11/11/2014 7:32 PM  [facility] [address] [place], [place] [address]  Phone [phone] Fax [phone]

## 2015-01-28 ENCOUNTER — Other Ambulatory Visit: Payer: Self-pay | Admitting: Neurology

## 2015-01-28 ENCOUNTER — Telehealth: Payer: Self-pay

## 2015-01-28 MED ORDER — MEMANTINE HCL ER 28 MG PO CP24: 28.0000 mg | ORAL_CAPSULE | Freq: Every day | ORAL | Status: DC

## 2015-01-28 NOTE — Telephone Encounter (Signed)
I will change the prescription to brand name Namenda.

## 2015-01-28 NOTE — Telephone Encounter (Signed)
[facility] sent a fax saying the patient no longer wishes to take generic Namenda, and would like to be change to Brand Name Namenda XR.  Please advise.  Thank you!

## 2015-01-31 NOTE — Telephone Encounter (Signed)
Pts wife called and was checking on the RX Namenda  XR for husband. She wanted to know the status and if it will be a problem with the insurance company going to a name brand. May call pt at [phone]

## 2015-01-31 NOTE — Telephone Encounter (Signed)
Rx was sent last week.  I called the pharmacy.  Spoke with [employer] International CorporationStephanie.  She verified they have the Rx ready for pick up.  I called the patient back.  They are aware.

## 2015-03-29 ENCOUNTER — Other Ambulatory Visit (HOSPITAL_COMMUNITY): Payer: Self-pay | Admitting: Interventional Radiology

## 2015-03-29 DIAGNOSIS — I771 Stricture of artery: Secondary | ICD-10-CM

## 2015-04-11 ENCOUNTER — Ambulatory Visit (HOSPITAL_COMMUNITY)
Admission: RE | Admit: 2015-04-11 | Discharge: 2015-04-11 | Disposition: A | Discharge status: Home / Self Care | Payer: Medicare Other | Source: Ambulatory Visit | Attending: Interventional Radiology | Admitting: Interventional Radiology

## 2015-04-11 DIAGNOSIS — I771 Stricture of artery: Secondary | ICD-10-CM

## 2015-04-18 ENCOUNTER — Other Ambulatory Visit (HOSPITAL_COMMUNITY): Payer: Self-pay | Admitting: Interventional Radiology

## 2015-04-18 DIAGNOSIS — I771 Stricture of artery: Secondary | ICD-10-CM

## 2015-04-27 ENCOUNTER — Other Ambulatory Visit: Payer: Self-pay | Admitting: Radiology

## 2015-04-28 ENCOUNTER — Telehealth (HOSPITAL_COMMUNITY): Payer: Self-pay | Admitting: Interventional Radiology

## 2015-04-28 ENCOUNTER — Other Ambulatory Visit: Payer: Self-pay | Admitting: Radiology

## 2015-04-28 NOTE — Telephone Encounter (Signed)
Called pt's wife, told her that her husband's appt would need to be canceled tomorrow and that I would call her back in the am to reschedule. She wanted to know if there was another physician who could do this procedure. I told her that I was sure that she could find another doctor to do this procedure but that I could not recommend anyone else to her. She agreed for me to call her back tomorrow. JM

## 2015-04-29 ENCOUNTER — Ambulatory Visit (HOSPITAL_COMMUNITY): Admission: RE | Admit: 2015-04-29 | Payer: Medicare Other | Source: Ambulatory Visit

## 2015-05-03 ENCOUNTER — Telehealth (HOSPITAL_COMMUNITY): Payer: Self-pay | Admitting: Interventional Radiology

## 2015-05-03 ENCOUNTER — Telehealth: Payer: Self-pay

## 2015-05-03 NOTE — Telephone Encounter (Signed)
Spoke to spouse about scheduling earlier appt w/ NP-Megan Millikan.  She said she was outside and would need to call back.

## 2015-05-03 NOTE — Telephone Encounter (Signed)
Spouse says she does not know when the procedure will be scheduled b/c the doctor had to go out of town for a family emergency. She is waiting to here back from his office. Explained if the procedure is scheduled w/in a month ok to schedule visit with NP-Megan afterwards. If the procedure is schedule greater than a month would need to [facility]. Spouse verbalized understanding.

## 2015-05-03 NOTE — Telephone Encounter (Signed)
Ok to Schedule after Procedure. Please find out when Procedure is.

## 2015-05-03 NOTE — Telephone Encounter (Signed)
Called pt 04/28/15 at 8 pm, told his wife that his appt would have to be cancelled due to Deveshwar having to leave on a family emergency. Told her that I would call her back early this week to reschedule. She states agreement with this plan. Called pt's home phone on 05/03/15. No answer and no VM. JM

## 2015-05-03 NOTE — Telephone Encounter (Signed)
Pt's wife returned Gregory Ortega's phone. 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 She is inquiring if [facility] as Dr Titus Dubinevashwar is thinking this could be causing some of pt's memory issues. Please call and advise at [phone]

## 2015-05-16 ENCOUNTER — Ambulatory Visit: Payer: Medicare Other | Admitting: Neurology

## 2015-05-16 ENCOUNTER — Other Ambulatory Visit: Payer: Self-pay | Admitting: Radiology

## 2015-05-17 ENCOUNTER — Other Ambulatory Visit (HOSPITAL_COMMUNITY): Payer: Self-pay | Admitting: Interventional Radiology

## 2015-05-17 ENCOUNTER — Ambulatory Visit (HOSPITAL_COMMUNITY)
Admission: RE | Admit: 2015-05-17 | Discharge: 2015-05-17 | Disposition: A | Discharge status: Home / Self Care | Payer: Medicare Other | Source: Ambulatory Visit | Attending: Interventional Radiology | Admitting: Interventional Radiology

## 2015-05-17 ENCOUNTER — Encounter (HOSPITAL_COMMUNITY): Payer: Self-pay

## 2015-05-17 DIAGNOSIS — Z7982 Long term (current) use of aspirin: Secondary | ICD-10-CM | POA: Insufficient documentation

## 2015-05-17 DIAGNOSIS — Z87891 Personal history of nicotine dependence: Secondary | ICD-10-CM | POA: Insufficient documentation

## 2015-05-17 DIAGNOSIS — Z7902 Long term (current) use of antithrombotics/antiplatelets: Secondary | ICD-10-CM | POA: Insufficient documentation

## 2015-05-17 DIAGNOSIS — R55 Syncope and collapse: Secondary | ICD-10-CM | POA: Insufficient documentation

## 2015-05-17 DIAGNOSIS — E1149 Type 2 diabetes mellitus with other diabetic neurological complication: Secondary | ICD-10-CM | POA: Insufficient documentation

## 2015-05-17 DIAGNOSIS — I771 Stricture of artery: Secondary | ICD-10-CM

## 2015-05-17 DIAGNOSIS — Z6833 Body mass index (BMI) 33.0-33.9, adult: Secondary | ICD-10-CM | POA: Insufficient documentation

## 2015-05-17 DIAGNOSIS — F0280 Dementia in other diseases classified elsewhere without behavioral disturbance: Secondary | ICD-10-CM | POA: Diagnosis not present

## 2015-05-17 DIAGNOSIS — I6523 Occlusion and stenosis of bilateral carotid arteries: Secondary | ICD-10-CM | POA: Diagnosis not present

## 2015-05-17 DIAGNOSIS — G309 Alzheimer's disease, unspecified: Secondary | ICD-10-CM | POA: Insufficient documentation

## 2015-05-17 DIAGNOSIS — E669 Obesity, unspecified: Secondary | ICD-10-CM | POA: Insufficient documentation

## 2015-05-17 DIAGNOSIS — I669 Occlusion and stenosis of unspecified cerebral artery: Secondary | ICD-10-CM | POA: Diagnosis not present

## 2015-05-17 DIAGNOSIS — I2510 Atherosclerotic heart disease of native coronary artery without angina pectoris: Secondary | ICD-10-CM | POA: Insufficient documentation

## 2015-05-17 DIAGNOSIS — K219 Gastro-esophageal reflux disease without esophagitis: Secondary | ICD-10-CM | POA: Diagnosis not present

## 2015-05-17 DIAGNOSIS — Z7984 Long term (current) use of oral hypoglycemic drugs: Secondary | ICD-10-CM | POA: Diagnosis not present

## 2015-05-17 DIAGNOSIS — Z8249 Family history of ischemic heart disease and other diseases of the circulatory system: Secondary | ICD-10-CM | POA: Insufficient documentation

## 2015-05-17 LAB — CBC WITH DIFFERENTIAL/PLATELET
BASOS ABS: 0.1 10*3/uL (ref 0.0–0.1)
BASOS PCT: 1 %
EOS ABS: 0.2 10*3/uL (ref 0.0–0.7)
EOS PCT: 3 %
HCT: 38.4 % — ABNORMAL LOW (ref 39.0–52.0)
Hemoglobin: 13 g/dL (ref 13.0–17.0)
LYMPHS PCT: 14 %
Lymphs Abs: 1 10*3/uL (ref 0.7–4.0)
MCH: 31.1 pg (ref 26.0–34.0)
MCHC: 33.9 g/dL (ref 30.0–36.0)
MCV: 91.9 fL (ref 78.0–100.0)
Monocytes Absolute: 0.7 10*3/uL (ref 0.1–1.0)
Monocytes Relative: 9 %
Neutro Abs: 5.2 10*3/uL (ref 1.7–7.7)
Neutrophils Relative %: 73 %
Platelets: 277 10*3/uL (ref 150–400)
RBC: 4.18 MIL/uL — ABNORMAL LOW (ref 4.22–5.81)
RDW: 13.2 % (ref 11.5–15.5)
WBC: 7.1 10*3/uL (ref 4.0–10.5)

## 2015-05-17 LAB — BASIC METABOLIC PANEL
Anion gap: 9 (ref 5–15)
BUN: 10 mg/dL (ref 6–20)
CALCIUM: 9.2 mg/dL (ref 8.9–10.3)
CO2: 26 mmol/L (ref 22–32)
Chloride: 102 mmol/L (ref 101–111)
Creatinine, Ser: 0.99 mg/dL (ref 0.61–1.24)
GFR calc Af Amer: >60 mL/min (ref >60)
Glucose, Bld: 152 mg/dL — ABNORMAL HIGH (ref 65–99)
POTASSIUM: 4.4 mmol/L (ref 3.5–5.1)
SODIUM: 137 mmol/L (ref 135–145)

## 2015-05-17 LAB — PROTIME-INR
INR: 1.03 (ref 0.00–1.49)
Prothrombin Time: 13.7 seconds (ref 11.6–15.2)

## 2015-05-17 LAB — GLUCOSE, CAPILLARY: GLUCOSE-CAPILLARY: 155 mg/dL — AB (ref 65–99)

## 2015-05-17 MED ORDER — FENTANYL CITRATE (PF) 100 MCG/2ML IJ SOLN
INTRAMUSCULAR | Status: AC | PRN
  Administered 2015-05-17: 25 ug via INTRAVENOUS

## 2015-05-17 MED ORDER — SODIUM CHLORIDE 0.9 % IV SOLN: INTRAVENOUS | Status: AC

## 2015-05-17 MED ORDER — IOHEXOL 300 MG/ML  SOLN: 50.0000 mL | Freq: Once | INTRAMUSCULAR | Status: DC | PRN

## 2015-05-17 MED ORDER — HEPARIN SOD (PORK) LOCK FLUSH 100 UNIT/ML IV SOLN
INTRAVENOUS | Status: AC
  Filled 2015-05-17: qty 15

## 2015-05-17 MED ORDER — FENTANYL CITRATE (PF) 100 MCG/2ML IJ SOLN
INTRAMUSCULAR | Status: AC
  Filled 2015-05-17: qty 4

## 2015-05-17 MED ORDER — IOHEXOL 300 MG/ML  SOLN
150.0000 mL | Freq: Once | INTRAMUSCULAR | Status: DC | PRN
  Administered 2015-05-17: 91 mL via INTRAVENOUS
  Filled 2015-05-17: qty 150

## 2015-05-17 MED ORDER — MIDAZOLAM HCL 2 MG/2ML IJ SOLN
INTRAMUSCULAR | Status: AC
  Filled 2015-05-17: qty 4

## 2015-05-17 MED ORDER — HEPARIN SODIUM (PORCINE) 1000 UNIT/ML IJ SOLN
INTRAMUSCULAR | Status: AC | PRN
  Administered 2015-05-17: 1000 [IU] via INTRAVENOUS

## 2015-05-17 MED ORDER — HEPARIN SODIUM (PORCINE) 1000 UNIT/ML IJ SOLN
INTRAMUSCULAR | Status: AC
  Filled 2015-05-17: qty 1

## 2015-05-17 MED ORDER — MIDAZOLAM HCL 2 MG/2ML IJ SOLN
INTRAMUSCULAR | Status: AC | PRN
  Administered 2015-05-17: 1 mg via INTRAVENOUS

## 2015-05-17 MED ORDER — SODIUM CHLORIDE 0.9 % IV SOLN
INTRAVENOUS | Status: DC
  Administered 2015-05-17: 10:00:00 via INTRAVENOUS

## 2015-05-17 NOTE — Sedation Documentation (Signed)
Patient is resting comfortably. 

## 2015-05-17 NOTE — Sedation Documentation (Signed)
5Fr. Exoseal applied to right groin 

## 2015-05-17 NOTE — Procedures (Signed)
S?p 4 vessel cerebral arteriogram. RT CFa approach. Findings. 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 4.Aberrant origin of RT subclavian artery

## 2015-05-17 NOTE — H&P (Signed)
History of Present Illness: Gregory Ortega is a 69 y.o. male with an episode of syncope approximately 1 month ago. CTA imaging with evidence of carotid stenosis. The patient has been seen by Dr. Corliss Skainseveshwar on 04/11/15 and scheduled today for a cerebral arteriogram. He denies any recurrent episodes of syncope. He denies any dizziness, extremity weakness, vision or speech changes. He does admit to memory difficulty, difficult to find words and difficult to write what he is thinking over the past year. He denies any chest pain. He does admit to dyspnea on exertion, he denies any associated diaphoresis or LE swelling.  He denies any active signs of bleeding or excessive bruising. He denies any recent fever or chills. The patient denies any history of sleep apnea or chronic oxygen use. He has previously tolerated sedation without complications.    Past Medical History  Diagnosis Date  . Type II or unspecified type diabetes mellitus without mention of complication, not stated as uncontrolled   . Coronary atherosclerosis of unspecified type of vessel, native or graft   . Type II or unspecified type diabetes mellitus with neurological manifestations, not stated as uncontrolled   . Esophageal reflux   . Obesity, unspecified   . Unspecified hereditary and idiopathic peripheral neuropathy   . Disturbance of skin sensation   . Other B-complex deficiencies   . Memory difficulty 04/22/2013  . Alzheimer's disease 11/11/2014    Past Surgical History  Procedure Laterality Date  . By-pass surgery  2005  . Moles removal  2013    Allergies: Statins and Ambien  Medications: Prior to Admission medications   Medication Sig Start Date End Date Taking? Authorizing Provider  aspirin 325 MG EC tablet Take 325 mg by mouth daily.   Yes Historical Provider, MD  benzonatate (TESSALON) 200 MG capsule Take 200 mg by mouth 3 (three) times daily as needed for cough.   Yes Historical Provider, MD  carvedilol (COREG)  6.25 MG tablet Take 3.175 mg by mouth 2 (two) times daily with a meal.    Yes Historical Provider, MD  clopidogrel (PLAVIX) 75 MG tablet Take 75 mg by mouth daily.   Yes Historical Provider, MD  co-enzyme Q-10 50 MG capsule Take 100 mg by mouth daily.    Yes Historical Provider, MD  Cyanocobalamin 2500 MCG TABS Take 2,500 mcg by mouth daily.    Yes Historical Provider, MD  docusate sodium (COLACE) 100 MG capsule Take 100 mg by mouth 2 (two) times daily.   Yes Historical Provider, MD  fexofenadine (ALLEGRA) 180 MG tablet Take 180 mg by mouth daily.   Yes Historical Provider, MD  fish oil-omega-3 fatty acids 1000 MG capsule Take 1 g by mouth 2 (two) times daily.    Yes Historical Provider, MD  memantine (NAMENDA XR) 28 MG CP24 24 hr capsule Take 1 capsule (28 mg total) by mouth daily. 01/28/15  Yes York Spanielharles K Willis, MD  metFORMIN (GLUCOPHAGE-XR) 500 MG 24 hr tablet Take 1,000 mg by mouth 2 (two) times daily.   Yes Historical Provider, MD  nitroGLYCERIN (NITROSTAT) 0.4 MG SL tablet Place 0.4 mg under the tongue every 5 (five) minutes as needed for chest pain.   Yes Historical Provider, MD  omeprazole (PRILOSEC) 20 MG capsule Take 20 mg by mouth daily.    Yes Historical Provider, MD  ranolazine (RANEXA) 1000 MG SR tablet Take 1,000 mg by mouth 2 (two) times daily.   Yes Historical Provider, MD  vitamin E 200 UNIT capsule Take  200 Units by mouth daily.   Yes Historical Provider, MD     Family History  Problem Relation Age of Onset  . Hypertension Mother   . Alzheimer's disease Mother   . Dementia Mother   . Cancer Brother   . Heart attack Brother   . Kidney disease Father   . Alzheimer's disease Father   . Rheum arthritis Sister     Social History   Social History  . Marital Status: Married    Spouse Name: N/A  . Number of Children: 2  . Years of Education: bachelor's   Occupational History  . Retired    Social History Main Topics  . Smoking status: Former Games developer  . Smokeless  tobacco: None  . Alcohol Use: No  . Drug Use: No  . Sexual Activity: Not Asked   Other Topics Concern  . None   Social History Narrative   Patient is left handed.   Patient rarely drinks caffeine.   Review of Systems: A 12 point ROS discussed and pertinent positives are indicated in the HPI above.  All other systems are negative.  Review of Systems  Vital Signs: BP 164/68 mmHg  Pulse 66  Temp(Src) 97.8 F (36.6 C) (Oral)  Resp 18  Ht 5' 5.5" (1.664 m)  Wt 206 lb (93.441 kg)  BMI 33.75 kg/m2  SpO2 99%  Physical Exam  Constitutional: He is oriented to person, place, and time. No distress.  HENT:  Head: Normocephalic and atraumatic.  Cardiovascular: Normal rate and regular rhythm.   Murmur heard. Pulmonary/Chest: Effort normal and breath sounds normal. No respiratory distress. He has no wheezes. He has no rales.  Musculoskeletal: He exhibits no edema.  Neurological: He is alert and oriented to person, place, and time.  Speech clear, but slow to respond, finger to nose test delayed, smile symmetrical, tongue midline, equal strength upper and lower extremities    Skin: He is not diaphoretic.    Mallampati Score:  MD Evaluation Airway: WNL Heart: WNL Abdomen: WNL Chest/ Lungs: WNL ASA  Classification: 3 Mallampati/Airway Score: Two  Imaging: No results found.  Labs:  CBC:  Recent Labs  05/17/15 0910  WBC 7.1  HGB 13.0  HCT 38.4*  PLT 277    COAGS:  Recent Labs  05/17/15 0910  INR 1.03    BMP:  Recent Labs  05/17/15 0910  NA 137  K 4.4  CL 102  CO2 26  GLUCOSE 152*  BUN 10  CALCIUM 9.2  CREATININE 0.99  GFRNONAA >60  GFRAA >60    Assessment and Plan: Syncope 1 month ago Abnormal imaging with evidence of carotid stenosis [facility] hospital  Seen by Dr. Corliss Skains on 04/11/15 Scheduled today for cerebral arteriogram with sedation The patient has been NPO, on ASA and Plavix, labs and vitals have been reviewed. Risks and Benefits  discussed with the patient including, but not limited to bleeding, infection, vascular injury, contrast induced renal failure or stroke. All of the patient's questions were answered, patient is agreeable to proceed. Consent signed and in chart.   SignedBerneta Levins 05/17/2015, 9:56 AM

## 2015-05-17 NOTE — Discharge Instructions (Signed)
Angiogram, Care After Refer to this sheet in the next few weeks. These instructions provide you with information about caring for yourself after your procedure. Your health care provider may also give you more specific instructions. Your treatment has been planned according to current medical practices, but problems sometimes occur. Call your health care provider if you have any problems or questions after your procedure. WHAT TO EXPECT AFTER THE PROCEDURE After your procedure, it is typical to have the following:  Bruising at the catheter insertion site that usually fades within 1-2 weeks.  Blood collecting in the tissue (hematoma) that may be painful to the touch. It should usually decrease in size and tenderness within 1-2 weeks. HOME CARE INSTRUCTIONS  Take medicines only as directed by your health care provider.  You may shower 24-48 hours after the procedure or as directed by your health care provider. Remove the bandage (dressing) and gently wash the site with plain soap and water. Pat the area dry with a clean towel. Do not rub the site, because this may cause bleeding.  Do not take baths, swim, or use a hot tub until your health care provider approves.  Check your insertion site every day for redness, swelling, or drainage.  Do not apply powder or lotion to the site.  Do not lift over 10 lb (4.5 kg) for 5 days after your procedure or as directed by your health care provider.  Ask your health care provider when it is okay to:  Return to work or school.  Resume usual physical activities or sports.  Resume sexual activity.  Do not drive home if you are discharged the same day as the procedure. Have someone else drive you.  You may drive 24 hours after the procedure unless otherwise instructed by your health care provider.  Do not operate machinery or power tools for 24 hours after the procedure or as directed by your health care provider.  If your procedure was done as an  outpatient procedure, which means that you went home the same day as your procedure, a responsible adult should be with you for the first 24 hours after you arrive home.  Keep all follow-up visits as directed by your health care provider. This is important. SEEK MEDICAL CARE IF:  You have a fever.  You have chills.  You have increased bleeding from the catheter insertion site. Hold pressure on the site and call 911. SEEK IMMEDIATE MEDICAL CARE IF:  You have unusual pain at the catheter insertion site.  You have redness, warmth, or swelling at the catheter insertion site.  You have drainage (other than a small amount of blood on the dressing) from the catheter insertion site.  The catheter insertion site is bleeding, and the bleeding does not stop after 30 minutes of holding steady pressure on the site.  The area near or just beyond the catheter insertion site becomes pale, cool, tingly, or numb.   This information is not intended to replace advice given to you by your health care provider. Make sure you discuss any questions you have with your health care provider.   Document Released: 01/18/2005 Document Revised: 07/23/2014 Document Reviewed: 12/03/2012 Elsevier Interactive Patient Education 2016 Elsevier Inc[employer] Angiogram, Care After Refer to this sheet in the next few weeks. These instructions provide you with information about caring for yourself after your procedure. Your health care provider may also give you more specific instructions. Your treatment has been planned according to current medical practices, but problems sometimes  occur. Call your health care provider if you have any problems or questions after your procedure. WHAT TO EXPECT AFTER THE PROCEDURE After your procedure, it is typical to have the following:  Bruising at the catheter insertion site that usually fades within 1-2 weeks.  Blood collecting in the tissue (hematoma) that may be painful to the touch. It should  usually decrease in size and tenderness within 1-2 weeks. HOME CARE INSTRUCTIONS  Take medicines only as directed by your health care provider.  You may shower 24-48 hours after the procedure or as directed by your health care provider. Remove the bandage (dressing) and gently wash the site with plain soap and water. Pat the area dry with a clean towel. Do not rub the site, because this may cause bleeding.  Do not take baths, swim, or use a hot tub until your health care provider approves.  Check your insertion site every day for redness, swelling, or drainage.  Do not apply powder or lotion to the site.  Do not lift over 10 lb (4.5 kg) for 5 days after your procedure or as directed by your health care provider.  Ask your health care provider when it is okay to:  Return to work or school.  Resume usual physical activities or sports.  Resume sexual activity.  Do not drive home if you are discharged the same day as the procedure. Have someone else drive you.  You may drive 24 hours after the procedure unless otherwise instructed by your health care provider.  Do not operate machinery or power tools for 24 hours after the procedure or as directed by your health care provider.  If your procedure was done as an outpatient procedure, which means that you went home the same day as your procedure, a responsible adult should be with you for the first 24 hours after you arrive home.  Keep all follow-up visits as directed by your health care provider. This is important. SEEK MEDICAL CARE IF:  You have a fever.  You have chills.  You have increased bleeding from the catheter insertion site. Hold pressure on the site. SEEK IMMEDIATE MEDICAL CARE IF:  You have unusual pain at the catheter insertion site.  You have redness, warmth, or swelling at the catheter insertion site.  You have drainage (other than a small amount of blood on the dressing) from the catheter insertion site.  The  catheter insertion site is bleeding, and the bleeding does not stop after 30 minutes of holding steady pressure on the site.  The area near or just beyond the catheter insertion site becomes pale, cool, tingly, or numb.   This information is not intended to replace advice given to you by your health care provider. Make sure you discuss any questions you have with your health care provider.   Document Released: 01/18/2005 Document Revised: 07/23/2014 Document Reviewed: 12/03/2012 Elsevier Interactive Patient Education [employer]2016 Elsevier Inc.

## 2015-05-26 ENCOUNTER — Ambulatory Visit (INDEPENDENT_AMBULATORY_CARE_PROVIDER_SITE_OTHER): Payer: Medicare Other | Admitting: Neurology

## 2015-05-26 ENCOUNTER — Encounter: Payer: Self-pay | Admitting: Neurology

## 2015-05-26 VITALS — BP 138/79 | HR 59 | Ht 65.0 in | Wt 195.0 lb

## 2015-05-26 DIAGNOSIS — G301 Alzheimer's disease with late onset: Secondary | ICD-10-CM | POA: Diagnosis not present

## 2015-05-26 DIAGNOSIS — F0280 Dementia in other diseases classified elsewhere without behavioral disturbance: Secondary | ICD-10-CM | POA: Diagnosis not present

## 2015-05-26 NOTE — Progress Notes (Signed)
Reason for visit: Memory disorder  Gregory Ortega is an 69 y.o. male  History of present illness:  Gregory Ortega is a 69 year old left-handed white male with a history of Alzheimer's disease. The patient has been on Namenda, he has not been able to tolerate Aricept in the past. The Namenda does have a calming effect for him. Prior to the dosing in the morning, he seems slightly agitated, but he does much better after he takes his medication. The patient will have some variations in cognitive functioning from day-to-day. If he is traveling, the confusion usually is worse. He has had issues with dressing himself, getting his shirts or pants turned around, and he may get up at night and had difficulty finding his way back from the bathroom. The patient has recently had a syncopal event after he had onset of chest pain, and he took nitroglycerin. The patient went to the hospital and a workup showed cerebrovascular disease, the patient underwent a cerebral angiogram that showed 40-50% stenosis in the origin of the vertebral arteries bilaterally, the left internal carotid artery revealed a 50% stenosis and a 65% stenosis was noted on the right internal carotid artery. There is 70% stenosis of the right middle cerebral artery at the M1 segment. The aspirin dosing was increased to a 325 mg tablet. The patient returns to this office for an evaluation. The patient is still doing some yard work, but he requires assistance with this, as he is no longer able to start the equipment by himself, or figure out how much fuel to put in the mowers.  Past Medical History  Diagnosis Date  . Type II or unspecified type diabetes mellitus without mention of complication, not stated as uncontrolled   . Coronary atherosclerosis of unspecified type of vessel, native or graft   . Type II or unspecified type diabetes mellitus with neurological manifestations, not stated as uncontrolled   . Esophageal reflux   . Obesity,  unspecified   . Unspecified hereditary and idiopathic peripheral neuropathy   . Disturbance of skin sensation   . Other B-complex deficiencies   . Memory difficulty 04/22/2013  . Alzheimer's disease 11/11/2014    Past Surgical History  Procedure Laterality Date  . By-pass surgery  2005  . Moles removal  2013    Family History  Problem Relation Age of Onset  . Hypertension Mother   . Alzheimer's disease Mother   . Dementia Mother   . Cancer Brother   . Heart attack Brother   . Kidney disease Father   . Alzheimer's disease Father   . Rheum arthritis Sister     Social history:  reports that he has quit smoking. He does not have any smokeless tobacco history on file. He reports that he does not drink alcohol or use illicit drugs.    Allergies  Allergen Reactions  . Statins Other (See Comments)    Confusion, memory loss and couldn't concentrate   . Ambien [Zolpidem Tartrate] Other (See Comments)    irritability    Medications:  Prior to Admission medications   Medication Sig Start Date End Date Taking? Authorizing Provider  aspirin 325 MG EC tablet Take 325 mg by mouth daily.   Yes Historical Provider, MD  benzonatate (TESSALON) 200 MG capsule Take 200 mg by mouth 3 (three) times daily as needed for cough.   Yes Historical Provider, MD  carvedilol (COREG) 6.25 MG tablet Take 6.25 mg by mouth 2 (two) times daily with a meal.  Yes Historical Provider, MD  clopidogrel (PLAVIX) 75 MG tablet Take 75 mg by mouth daily.   Yes Historical Provider, MD  co-enzyme Q-10 50 MG capsule Take 100 mg by mouth daily.    Yes Historical Provider, MD  Cyanocobalamin 2500 MCG TABS Take 2,500 mcg by mouth daily.    Yes Historical Provider, MD  docusate sodium (COLACE) 100 MG capsule Take 100 mg by mouth 2 (two) times daily.   Yes Historical Provider, MD  fexofenadine (ALLEGRA) 180 MG tablet Take 180 mg by mouth daily.   Yes Historical Provider, MD  fish oil-omega-3 fatty acids 1000 MG capsule  Take 1 g by mouth 2 (two) times daily.    Yes Historical Provider, MD  memantine (NAMENDA XR) 28 MG CP24 24 hr capsule Take 1 capsule (28 mg total) by mouth daily. 01/28/15  Yes York Spanielharles K Messina Kosinski, MD  metFORMIN (GLUCOPHAGE-XR) 500 MG 24 hr tablet Take 1,000 mg by mouth 2 (two) times daily.   Yes Historical Provider, MD  nitroGLYCERIN (NITROSTAT) 0.4 MG SL tablet Place 0.4 mg under the tongue every 5 (five) minutes as needed for chest pain.   Yes Historical Provider, MD  omeprazole (PRILOSEC) 20 MG capsule Take 20 mg by mouth daily.    Yes Historical Provider, MD  ranolazine (RANEXA) 1000 MG SR tablet Take 1,000 mg by mouth 2 (two) times daily.   Yes Historical Provider, MD  vitamin E 200 UNIT capsule Take 200 Units by mouth daily.   Yes Historical Provider, MD    ROS:  Out of a complete 14 system review of symptoms, the patient complains only of the following symptoms, and all other reviewed systems are negative.  Decreased activity Cough Syncope Memory loss  Blood pressure 138/79, pulse 59, height 5\' 5"  (1.651 m), weight 195 lb (88.451 kg).  Physical Exam  General: The patient is alert and cooperative at the time of the examination. The patient is moderately obese.  Skin: No significant peripheral edema is noted.   Neurologic Exam  Mental status: The patient is alert and oriented x 2 at the time of the examination (not oriented to date). The Mini-Mental Status Examination done today shows a total score of 12/30..   Cranial nerves: Facial symmetry is present. Speech is normal, no aphasia or dysarthria is noted. Extraocular movements are full. Visual fields are full.  Motor: The patient has good strength in all 4 extremities.  Sensory examination: Soft touch sensation is symmetric on the face, arms, and legs.  Coordination: The patient has good finger-nose-finger and heel-to-shin bilaterally. Apraxia is noted with the use of the extremities.  Gait and station: The patient has a  normal gait. Tandem gait is normal. Romberg is negative. No drift is seen.  Reflexes: Deep tendon reflexes are symmetric.   Assessment/Plan:  1. Alzheimer's disease  2. Cerebrovascular disease  The patient has continued to decline with his cognitive functioning. The patient is on Namenda, we will continue this. He was not a candidate for the Alzheimer's research project through this office. The patient will follow-up in 8 months, sooner if needed.  Marlan Ortega. Keith Tyffani Foglesong MD 05/26/2015 1:04 PM  [facility] [address] [place] [id number]  Phone [phone] Fax [phone]

## 2015-05-26 NOTE — Patient Instructions (Signed)
Alzheimer Disease °Alzheimer disease is a mental disorder. It causes memory loss and loss of other mental functions, such as learning, thinking, problem solving, communicating, and completing tasks. The mental losses interfere with the ability to perform daily activities at work, at home, or in social situations. °Alzheimer disease usually starts in a person's late 60s or early 70s but can start earlier in life (familial form). The mental changes caused by this disease are permanent and worsen over time. As the illness progresses, the ability to do even the simplest things is lost. Survival with Alzheimer disease ranges from several years to as long as 20 years. °CAUSES °Alzheimer disease is caused by abnormally high levels of a protein (beta-amyloid) in the brain. This protein forms very small deposits within and around the brain's nerve cells. These deposits prevent the nerve cells from working properly. Experts are not certain what causes the beta-amyloid deposits in this disease. °RISK FACTORS °The following major risk factors have been identified: °· Increasing age. °· Certain genetic variations, such as Down syndrome (trisomy 21). °SYMPTOMS °In the early stages of Alzheimer disease, you are still able to perform daily activities but need greater effort, more time, or memory aids. Early symptoms include: °· Mild memory loss of recent events, names, or phone numbers. °· Loss of objects. °· Minor loss of vocabulary. °· Difficulty with complex tasks, such as paying bills or driving in unfamiliar locations. °Other mental functions deteriorate as the disease worsens. These changes slowly go from mild to severe. Symptoms at this stage include: °· Difficulty remembering. You may not be able to recall personal information such as your address and telephone number. You may become confused about the date, the season of the year, or your location. °· Difficulty maintaining attention. You may forget what you wanted to say  during conversations and repeat what you have already said. °· Difficulty learning new information or tasks. You may not remember what you read or the name of a new friend you met. °· Difficulty counting or doing math. You may have difficulty with complex math problems. You may make mistakes in paying bills or managing your checkbook. °· Poor reasoning and judgment. You may make poor decisions or not dress right for the weather. °· Difficulty communicating. You may have regular difficulty remembering words, naming objects, expressing yourself clearly, or writing sentences that make sense. °· Difficulty performing familiar daily activities. You may get lost driving in familiar locations or need help eating, bathing, dressing, grooming, or using the toilet. You may have difficulty maintaining bladder or bowel control. °· Difficulty recognizing familiar faces. You may confuse family members or close friends with one another. You may not recognize a close relative or may mistake strangers for family. °Alzheimer disease also may cause changes in personality and behavior. These changes include:  °· Loss of interest or motivation. °· Social withdrawal. °· Anxiety. °· Difficulty sleeping. °· Uncharacteristic anger or combativeness. °· A false belief that someone is trying to harm you (paranoia). °· Seeing things that are not real (hallucinations). °· Agitation. °Confusion and disruptive behavior are often worse at night and may be triggered by changes in the environment or acute medical issues. °DIAGNOSIS  °Alzheimer disease is diagnosed through an assessment by your health care provider. During this assessment, your health care provider will do the following: °· Ask you and your family, friends, or caregivers questions about your symptoms, their frequency, their duration and progression, and the effect they are having on your life. °·   Ask questions about your personal and family medical history and use of alcohol or drugs,  including prescription medicine.  Perform a physical exam and order blood tests and brain imaging exams. Your health care provider may refer you to a specialist for detailed evaluation of your mental functions (neuropsychological testing).  Many different brain disorders, medical conditions, and certain substances can cause symptoms that resemble Alzheimer disease symptoms. These must be ruled out before this disease can be diagnosed. If Alzheimer disease is diagnosed, it will be considered either "possible" or "probable" Alzheimer disease. "Possible" Alzheimer disease means that your symptoms are typical of the disease and no other disorder is causing them. "Probable" Alzheimer disease means that you also have a family history of the disease or genetic test results that support the diagnosis. Certain tests, mostly used in research studies, are highly specific for Alzheimer disease.  TREATMENT  There is currently no cure for this disease. The goals of treatment are to:  Slow down the progression of the disease.  Preserve mental function as long as possible.  Manage behavioral symptoms.  Make life easier for the person with Alzheimer disease and his or her caregivers. The following treatment options are available:  Medicine. Certain medicines may help slow memory loss by changing the level of certain chemicals in the brain. Medicine may also help with behavioral symptoms.  Talk therapy. Talk therapy provides education, support, and memory aids for people with this disease. It is most effective in the early stages of the illness.  Caregiving. Caregivers may be family members, friends, or trained medical professionals. They help the person with Alzheimer disease with daily life activities. Caregiving may take place at home or at a nursing facility.  Family support groups. These provide education, emotional support, and information about community resources to family members who are taking care of  the person with this disease.   This information is not intended to replace advice given to you by your health care provider. Make sure you discuss any questions you have with your health care provider.   Document Released: 03/13/2004 Document Revised: 07/23/2014 Document Reviewed: 11/07/2012 Elsevier Interactive Patient Education [employer].

## 2015-07-14 ENCOUNTER — Other Ambulatory Visit: Payer: Self-pay | Admitting: Neurology

## 2015-07-17 HISTORY — PX: CORONARY STENT PLACEMENT: SHX1402

## 2015-11-22 ENCOUNTER — Other Ambulatory Visit (HOSPITAL_COMMUNITY): Payer: Self-pay | Admitting: Interventional Radiology

## 2015-11-22 DIAGNOSIS — I771 Stricture of artery: Secondary | ICD-10-CM

## 2015-12-13 ENCOUNTER — Encounter (HOSPITAL_COMMUNITY): Payer: Self-pay

## 2015-12-13 ENCOUNTER — Ambulatory Visit (HOSPITAL_COMMUNITY)
Admission: RE | Admit: 2015-12-13 | Discharge: 2015-12-13 | Disposition: A | Discharge status: Home / Self Care | Payer: Medicare Other | Source: Ambulatory Visit | Attending: Interventional Radiology | Admitting: Interventional Radiology

## 2015-12-13 DIAGNOSIS — I771 Stricture of artery: Secondary | ICD-10-CM

## 2015-12-13 DIAGNOSIS — I6523 Occlusion and stenosis of bilateral carotid arteries: Secondary | ICD-10-CM | POA: Insufficient documentation

## 2015-12-13 LAB — POCT I-STAT CREATININE: CREATININE: 1.1 mg/dL (ref 0.61–1.24)

## 2015-12-13 MED ORDER — IOPAMIDOL (ISOVUE-370) INJECTION 76%
INTRAVENOUS | Status: AC
  Administered 2015-12-13: 50 mL
  Filled 2015-12-13: qty 50

## 2015-12-15 ENCOUNTER — Inpatient Hospital Stay: Admission: EM | Admit: 2015-12-15 | Payer: Self-pay | Source: Other Acute Inpatient Hospital | Admitting: Neurology

## 2015-12-15 ENCOUNTER — Encounter (HOSPITAL_COMMUNITY): Payer: Self-pay | Admitting: Emergency Medicine

## 2015-12-15 ENCOUNTER — Observation Stay (HOSPITAL_COMMUNITY)
Admission: EM | Admit: 2015-12-15 | Discharge: 2015-12-16 | Disposition: A | Discharge status: Home / Self Care | Payer: Medicare Other | Attending: Internal Medicine | Admitting: Internal Medicine

## 2015-12-15 DIAGNOSIS — R2990 Unspecified symptoms and signs involving the nervous system: Secondary | ICD-10-CM | POA: Diagnosis not present

## 2015-12-15 DIAGNOSIS — K219 Gastro-esophageal reflux disease without esophagitis: Secondary | ICD-10-CM

## 2015-12-15 DIAGNOSIS — G459 Transient cerebral ischemic attack, unspecified: Secondary | ICD-10-CM | POA: Diagnosis not present

## 2015-12-15 DIAGNOSIS — G309 Alzheimer's disease, unspecified: Secondary | ICD-10-CM | POA: Diagnosis not present

## 2015-12-15 DIAGNOSIS — R269 Unspecified abnormalities of gait and mobility: Secondary | ICD-10-CM | POA: Insufficient documentation

## 2015-12-15 DIAGNOSIS — F0280 Dementia in other diseases classified elsewhere without behavioral disturbance: Secondary | ICD-10-CM

## 2015-12-15 DIAGNOSIS — E1142 Type 2 diabetes mellitus with diabetic polyneuropathy: Secondary | ICD-10-CM | POA: Diagnosis not present

## 2015-12-15 DIAGNOSIS — E119 Type 2 diabetes mellitus without complications: Secondary | ICD-10-CM

## 2015-12-15 DIAGNOSIS — I10 Essential (primary) hypertension: Secondary | ICD-10-CM | POA: Diagnosis not present

## 2015-12-15 DIAGNOSIS — E785 Hyperlipidemia, unspecified: Secondary | ICD-10-CM | POA: Diagnosis not present

## 2015-12-15 DIAGNOSIS — Z66 Do not resuscitate: Secondary | ICD-10-CM | POA: Insufficient documentation

## 2015-12-15 DIAGNOSIS — I2510 Atherosclerotic heart disease of native coronary artery without angina pectoris: Secondary | ICD-10-CM

## 2015-12-15 DIAGNOSIS — E669 Obesity, unspecified: Secondary | ICD-10-CM | POA: Diagnosis not present

## 2015-12-15 DIAGNOSIS — Z7982 Long term (current) use of aspirin: Secondary | ICD-10-CM | POA: Diagnosis not present

## 2015-12-15 DIAGNOSIS — R531 Weakness: Secondary | ICD-10-CM | POA: Diagnosis present

## 2015-12-15 DIAGNOSIS — E1140 Type 2 diabetes mellitus with diabetic neuropathy, unspecified: Secondary | ICD-10-CM

## 2015-12-15 DIAGNOSIS — Z7902 Long term (current) use of antithrombotics/antiplatelets: Secondary | ICD-10-CM | POA: Diagnosis not present

## 2015-12-15 DIAGNOSIS — Z87891 Personal history of nicotine dependence: Secondary | ICD-10-CM | POA: Diagnosis not present

## 2015-12-15 DIAGNOSIS — Z79899 Other long term (current) drug therapy: Secondary | ICD-10-CM | POA: Diagnosis not present

## 2015-12-15 LAB — BASIC METABOLIC PANEL
Anion gap: 7 (ref 5–15)
BUN: 14 mg/dL (ref 6–20)
CALCIUM: 9 mg/dL (ref 8.9–10.3)
CO2: 23 mmol/L (ref 22–32)
CREATININE: 0.99 mg/dL (ref 0.61–1.24)
Chloride: 104 mmol/L (ref 101–111)
GFR calc non Af Amer: >60 mL/min (ref >60)
Glucose, Bld: 144 mg/dL — ABNORMAL HIGH (ref 65–99)
Potassium: 3.7 mmol/L (ref 3.5–5.1)
SODIUM: 134 mmol/L — AB (ref 135–145)

## 2015-12-15 LAB — HEPATIC FUNCTION PANEL
ALT: 15 U/L — ABNORMAL LOW (ref 17–63)
AST: 16 U/L (ref 15–41)
Albumin: 3.5 g/dL (ref 3.5–5.0)
Alkaline Phosphatase: 55 U/L (ref 38–126)
BILIRUBIN DIRECT: 0.1 mg/dL (ref 0.1–0.5)
BILIRUBIN INDIRECT: 1 mg/dL — AB (ref 0.3–0.9)
TOTAL PROTEIN: 6.1 g/dL — AB (ref 6.5–8.1)
Total Bilirubin: 1.1 mg/dL (ref 0.3–1.2)

## 2015-12-15 LAB — GLUCOSE, CAPILLARY
GLUCOSE-CAPILLARY: 141 mg/dL — AB (ref 65–99)
GLUCOSE-CAPILLARY: 158 mg/dL — AB (ref 65–99)

## 2015-12-15 LAB — MAGNESIUM: Magnesium: 2 mg/dL (ref 1.7–2.4)

## 2015-12-15 LAB — VITAMIN B12: Vitamin B-12: 946 pg/mL — ABNORMAL HIGH (ref 180–914)

## 2015-12-15 LAB — TSH: TSH: 1.159 u[IU]/mL (ref 0.350–4.500)

## 2015-12-15 LAB — PHOSPHORUS: PHOSPHORUS: 3.4 mg/dL (ref 2.5–4.6)

## 2015-12-15 MED ORDER — DOCUSATE SODIUM 100 MG PO CAPS
100.0000 mg | ORAL_CAPSULE | Freq: Two times a day (BID) | ORAL | Status: DC
  Administered 2015-12-15: 100 mg via ORAL
  Filled 2015-12-15 (×2): qty 1

## 2015-12-15 MED ORDER — PANTOPRAZOLE SODIUM 40 MG PO TBEC
40.0000 mg | DELAYED_RELEASE_TABLET | Freq: Every day | ORAL | Status: DC
  Administered 2015-12-16 (×2): 40 mg via ORAL
  Filled 2015-12-15 (×2): qty 1

## 2015-12-15 MED ORDER — STROKE: EARLY STAGES OF RECOVERY BOOK
Freq: Once | Status: AC
  Administered 2015-12-15: 19:00:00

## 2015-12-15 MED ORDER — RANOLAZINE ER 500 MG PO TB12
1000.0000 mg | ORAL_TABLET | Freq: Two times a day (BID) | ORAL | Status: DC
  Administered 2015-12-15 – 2015-12-16 (×2): 1000 mg via ORAL
  Filled 2015-12-15 (×2): qty 2

## 2015-12-15 MED ORDER — MEMANTINE HCL ER 28 MG PO CP24
28.0000 mg | ORAL_CAPSULE | Freq: Every day | ORAL | Status: DC
  Administered 2015-12-16: 28 mg via ORAL
  Filled 2015-12-15 (×2): qty 1

## 2015-12-15 MED ORDER — INSULIN ASPART 100 UNIT/ML ~~LOC~~ SOLN
0.0000 [IU] | Freq: Three times a day (TID) | SUBCUTANEOUS | Status: DC
  Administered 2015-12-16: 2 [IU] via SUBCUTANEOUS

## 2015-12-15 MED ORDER — ACETAMINOPHEN 325 MG PO TABS: 650.0000 mg | ORAL_TABLET | ORAL | Status: DC | PRN

## 2015-12-15 MED ORDER — ASPIRIN EC 81 MG PO TBEC
81.0000 mg | DELAYED_RELEASE_TABLET | ORAL | Status: DC
  Administered 2015-12-16: 81 mg via ORAL
  Filled 2015-12-15: qty 1

## 2015-12-15 MED ORDER — TICAGRELOR 90 MG PO TABS
90.0000 mg | ORAL_TABLET | Freq: Two times a day (BID) | ORAL | Status: DC
  Administered 2015-12-15 – 2015-12-16 (×2): 90 mg via ORAL
  Filled 2015-12-15 (×3): qty 1

## 2015-12-15 MED ORDER — CARVEDILOL 6.25 MG PO TABS
6.2500 mg | ORAL_TABLET | Freq: Two times a day (BID) | ORAL | Status: DC
  Administered 2015-12-15 – 2015-12-16 (×2): 6.25 mg via ORAL
  Filled 2015-12-15 (×2): qty 1

## 2015-12-15 MED ORDER — ATORVASTATIN CALCIUM 10 MG PO TABS
5.0000 mg | ORAL_TABLET | Freq: Every day | ORAL | Status: DC
Start: 2015-12-15 — End: 2015-12-16
  Administered 2015-12-15: 5 mg via ORAL
  Filled 2015-12-15: qty 1

## 2015-12-15 MED ORDER — VITAMIN E 180 MG (400 UNIT) PO CAPS
400.0000 [IU] | ORAL_CAPSULE | ORAL | Status: DC
  Administered 2015-12-16: 400 [IU] via ORAL
  Filled 2015-12-15: qty 1

## 2015-12-15 MED ORDER — VITAMIN B-12 1000 MCG PO TABS
1000.0000 ug | ORAL_TABLET | Freq: Every day | ORAL | Status: DC
  Administered 2015-12-15 – 2015-12-16 (×2): 1000 ug via ORAL
  Filled 2015-12-15 (×3): qty 1
  Filled 2015-12-15: qty 4

## 2015-12-15 MED ORDER — ACETAMINOPHEN 650 MG RE SUPP: 650.0000 mg | RECTAL | Status: DC | PRN

## 2015-12-15 NOTE — Consult Note (Signed)
NEURO HOSPITALIST CONSULT NOTE   Requestig physician: Dr. Fredderick Phenix   Reason for Consult: Stroke   History obtained from:  Patient   HPI:                                                                                                                                          Gregory Ortega is an 70 y.o. male with history of diabetes, coronary artery disease and dementia presents with right arm weakness. He presents as a transfer form [facility] emergency department. He was seen there after he woke up this morning with right arm weakness and numbness. He states she was normal when he went to bed last night. He doesn't remember waking up at all during the night. When he woke up this morning, he noticed that his right arm was weaker and he was having trouble controlling it. He also reported numbness in the right hand.  head CT that was negative.  For his Alzheimer disease he is on Namenda, he has not been able to tolerate Aricept in the past. The Namenda does have a calming effect for him.  Patient underwent a CTA head and neck 2 days ago for LEFT sided numbness. This stated Stable head and neck arterial findings since 05/17/2015  Which at that time showed 40-50% stenosis in the origin of the vertebral arteries bilaterally, the left internal carotid artery revealed a 50% stenosis and a 65% stenosis was noted on the right internal carotid artery. There is 70% stenosis of the right middle cerebral artery at the M1 segment.  Currently he states his right arm still feels decreased sensation however this is inconsistent.   Past Medical History  Diagnosis Date  . Type II or unspecified type diabetes mellitus without mention of complication, not stated as uncontrolled   . Coronary atherosclerosis of unspecified type of vessel, native or graft   . Type II or unspecified type diabetes mellitus with neurological manifestations, not stated as uncontrolled   . Esophageal reflux   .  Obesity, unspecified   . Unspecified hereditary and idiopathic peripheral neuropathy   . Disturbance of skin sensation   . Other B-complex deficiencies   . Memory difficulty 04/22/2013  . Alzheimer's disease 11/11/2014    Past Surgical History  Procedure Laterality Date  . By-pass surgery  2005  . Moles removal  2013    Family History  Problem Relation Age of Onset  . Hypertension Mother   . Alzheimer's disease Mother   . Dementia Mother   . Cancer Brother   . Heart attack Brother   . Kidney disease Father   . Alzheimer's disease Father   . Rheum arthritis Sister       Social History:  reports that he has quit smoking. He  does not have any smokeless tobacco history on file. He reports that he does not drink alcohol or use illicit drugs.  Allergies  Allergen Reactions  . Statins Other (See Comments)    Confusion, memory loss and couldn't concentrate   . Ambien [Zolpidem Tartrate] Other (See Comments)    irritability    MEDICATIONS:                                                                                                                     No current facility-administered medications for this encounter.   Current Outpatient Prescriptions  Medication Sig Dispense Refill  . aspirin 325 MG EC tablet Take 325 mg by mouth daily.    . benzonatate (TESSALON) 200 MG capsule Take 200 mg by mouth 3 (three) times daily as needed for cough.    . carvedilol (COREG) 6.25 MG tablet Take 6.25 mg by mouth 2 (two) times daily with a meal.     . clopidogrel (PLAVIX) 75 MG tablet Take 75 mg by mouth daily.    Marland Kitchen co-enzyme Q-10 50 MG capsule Take 100 mg by mouth daily.     . Cyanocobalamin 2500 MCG TABS Take 2,500 mcg by mouth daily.     Marland Kitchen docusate sodium (COLACE) 100 MG capsule Take 100 mg by mouth 2 (two) times daily.    . fexofenadine (ALLEGRA) 180 MG tablet Take 180 mg by mouth daily.    . fish oil-omega-3 fatty acids 1000 MG capsule Take 1 g by mouth 2 (two) times daily.     .  metFORMIN (GLUCOPHAGE-XR) 500 MG 24 hr tablet Take 1,000 mg by mouth 2 (two) times daily.    Marland Kitchen NAMENDA XR 28 MG CP24 24 hr capsule TAKE ONE CAPSULE BY MOUTH DAILY 30 capsule 6  . nitroGLYCERIN (NITROSTAT) 0.4 MG SL tablet Place 0.4 mg under the tongue every 5 (five) minutes as needed for chest pain.    Marland Kitchen omeprazole (PRILOSEC) 20 MG capsule Take 20 mg by mouth daily.     . ranolazine (RANEXA) 1000 MG SR tablet Take 1,000 mg by mouth 2 (two) times daily.    . vitamin E 200 UNIT capsule Take 200 Units by mouth daily.        ROS:  History obtained from unobtainable from patient due to mental status  General ROS: negative for - chills, fatigue, fever, night sweats, weight gain or weight loss Psychological ROS: negative for - behavioral disorder, hallucinations, memory difficulties, mood swings or suicidal ideation Ophthalmic ROS: negative for - blurry vision, double vision, eye pain or loss of vision ENT ROS: negative for - epistaxis, nasal discharge, oral lesions, sore throat, tinnitus or vertigo Allergy and Immunology ROS: negative for - hives or itchy/watery eyes Hematological and Lymphatic ROS: negative for - bleeding problems, bruising or swollen lymph nodes Endocrine ROS: negative for - galactorrhea, hair pattern changes, polydipsia/polyuria or temperature intolerance Respiratory ROS: negative for - cough, hemoptysis, shortness of breath or wheezing Cardiovascular ROS: negative for - chest pain, dyspnea on exertion, edema or irregular heartbeat Gastrointestinal ROS: negative for - abdominal pain, diarrhea, hematemesis, nausea/vomiting or stool incontinence Genito-Urinary ROS: negative for - dysuria, hematuria, incontinence or urinary frequency/urgency Musculoskeletal ROS: negative for - joint swelling or muscular weakness Neurological ROS: as noted in  HPI Dermatological ROS: negative for rash and skin lesion changes   Blood pressure 182/78, pulse 57, temperature 98.6 F (37 C), temperature source Oral, resp. rate 18, SpO2 96 %.   Neurologic Examination:                                                                                                      HEENT-  Normocephalic, no lesions, without obvious abnormality.  Normal external eye and conjunctiva.  Normal TM's bilaterally.  Normal auditory canals and external ears. Normal external nose, mucus membranes and septum.  Normal pharynx. Cardiovascular- regularly irregular rhythm, pulses palpable throughout   Lungs- chest clear, no wheezing, rales, normal symmetric air entry, Heart exam - S1, S2 normal, no murmur, no gallop, rate regular Abdomen- normal findings: bowel sounds normal Extremities- no edema Lymph-no adenopathy palpable Musculoskeletal-no joint tenderness, deformity or swelling Skin-warm and dry, no hyperpigmentation, vitiligo, or suspicious lesions  Neurological Examination Mental Status: Alert, oriented to [place].Marland Kitchen  Speech fluent without evidence of aphasia.  Able to follow 3 step commands without difficulty. Cranial Nerves: II: Visual fields grossly normal, pupils equal, round, reactive to light and accommodation III,IV, VI: ptosis not present, extra-ocular motions intact bilaterally V,VII: smile symmetric, facial light touch sensation normal bilaterally VIII: hearing normal bilaterally IX,X: uvula rises symmetrically XI: bilateral shoulder shrug XII: midline tongue extension Motor: Right : Upper extremity   5/5    Left:     Upper extremity   5/5  Lower extremity   5/5     Lower extremity   5/5 Tone and bulk:normal tone throughout; no atrophy noted Sensory: Pinprick and light touch intact throughout, bilaterally Deep Tendon Reflexes: 1+ and symmetric throughout Plantars: Right: downgoing   Left: downgoing Cerebellar: normal finger-to-nose,and normal  heel-to-shin test Gait: not tested      Lab Results: Basic Metabolic Panel:  Recent Labs Lab 12/13/15 1236  CREATININE 1.10    Liver Function Tests: No results for input(s): AST, ALT, ALKPHOS, BILITOT, PROT, ALBUMIN in the last 168 hours. No results for input(s): LIPASE, AMYLASE  in the last 168 hours. No results for input(s): AMMONIA in the last 168 hours.  CBC: No results for input(s): WBC, NEUTROABS, HGB, HCT, MCV, PLT in the last 168 hours.  Cardiac Enzymes: No results for input(s): CKTOTAL, CKMB, CKMBINDEX, TROPONINI in the last 168 hours.  Lipid Panel: No results for input(s): CHOL, TRIG, HDL, CHOLHDL, VLDL, LDLCALC in the last 168 hours.  CBG: No results for input(s): GLUCAP in the last 168 hours.  Microbiology: No results found for this or any previous visit.  Coagulation Studies: No results for input(s): LABPROT, INR in the last 72 hours.  Imaging: No results found.     Assessment and plan per attending neurologist  Felicie Mornavid Smith PA-C [facility] [phone]  12/15/2015, 2:30 PM   Assessment/Plan: 70 YO male with right arm numbness and weakness which has resolved. He recently had a A1c 5/31 which was 6.8 and a CTA head and neck 5/30 which showed no change from 2016.  Echo done 3 months ago was normal. Given his significant atherosclerosis of both neck and cardiovascular system TIA is definitely in differential.   Recommend: 1) MRI brain 2) LDL

## 2015-12-15 NOTE — ED Notes (Signed)
Pt given cup of water and ice.  EDP advised if he had passed his swallow screen it was ok.

## 2015-12-15 NOTE — H&P (Signed)
History and Physical    Gregory Ortega RUE:[id number] DOB: Apr 26, 1946 DOA: 12/15/2015  Referring Provider: Dr. Fredderick Phenix PCP: Feliciana Rossetti, MD   Patient coming from: Home  Chief Complaint: right arm weakness/hand numbness  HPI: Gregory Ortega is a 70 y.o. 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 Symptoms present since he woke this morning and new according to patient (as he went to bed w/o any complaints). Patient denies CP, SOB, nausea, vomiting, HA's, blurred vision, melena, hematuria, dysuria, fever, chills or any other complaints. Wife reported some intermittent episodes of confusion and difficulty comprhending commands over the weekend.  By the time of my evaluation all his symptoms were resolved. Neurology was consulted and the plan was to complete work up for TIA.  ED Course: gentle IVF's given and neurology consulted. [facility] called to admit patient for further evaluation and work up of TIA.  Review of Systems:  All other systems reviewed and apart from HPI, are negative.  Past Medical History  Diagnosis Date  . Type II or unspecified type diabetes mellitus without mention of complication, not stated as uncontrolled   . Coronary atherosclerosis of unspecified type of vessel, native or graft   . Type II or unspecified type diabetes mellitus with neurological manifestations, not stated as uncontrolled   . Esophageal reflux   . Obesity, unspecified   . Unspecified hereditary and idiopathic peripheral neuropathy   . Disturbance of skin sensation   . Other B-complex deficiencies   . Memory difficulty 04/22/2013  . Alzheimer's disease 11/11/2014    Past Surgical History    Procedure Laterality Date  . By-pass surgery  2005  . Moles removal  2013     reports that he has quit smoking. He does not have any smokeless tobacco history on file. He reports that he does not drink alcohol or use illicit drugs.  Allergies  Allergen Reactions  . Statins Other (See Comments)    Confusion, memory loss and couldn't concentrate   . Ambien [Zolpidem Tartrate] Other (See Comments)    irritability  . Metformin And Related Other (See Comments)    Blood sugar dropped too low   . Rosuvastatin Calcium Other (See Comments)    Confusion, memory loss and couldn't concentrate   . Zetia [Ezetimibe] Other (See Comments)    Blood sugar dropped too low    Family History  Problem Relation Age of Onset  . Hypertension Mother   . Alzheimer's disease Mother   . Dementia Mother   . Cancer Brother   . Heart attack Brother   . Kidney disease Father   . Alzheimer's disease Father   . Rheum arthritis Sister     Prior to Admission medications   Medication Sig Start Date End Date Taking? Authorizing Provider  aspirin EC 81 MG tablet Take 81 mg by mouth every morning.   Yes Historical Provider, MD  atorvastatin (LIPITOR) 10 MG tablet Take 5 mg by mouth at bedtime.   Yes Historical Provider, MD  BRILINTA 90 MG TABS tablet Take 90 mg by mouth 2 (two) times daily. 12/08/15  Yes Historical Provider, MD  carvedilol (COREG) 6.25 MG tablet Take 6.25 mg by mouth 2 (two) times daily with a meal.  Yes Historical Provider, MD  co-enzyme Q-10 50 MG capsule Take 200 mg by mouth every morning.    Yes Historical Provider, MD  Cyanocobalamin 2500 MCG TABS Take 1,000 mcg by mouth daily.    Yes Historical Provider, MD  docusate sodium (COLACE) 100 MG capsule Take 100 mg by mouth 2 (two) times daily.   Yes Historical Provider, MD  fexofenadine (ALLEGRA) 180 MG tablet Take 180 mg by mouth daily as needed for allergies.    Yes Historical Provider, MD  NAMENDA XR 28 MG CP24 24 hr capsule TAKE ONE  CAPSULE BY MOUTH DAILY 07/14/15  Yes York Spaniel, MD  nitroGLYCERIN (NITROSTAT) 0.4 MG SL tablet Place 0.4 mg under the tongue every 5 (five) minutes as needed for chest pain.   Yes Historical Provider, MD  Omega-3 Fatty Acids (SALMON OIL-1000) 200 MG CAPS Take by mouth.   Yes Historical Provider, MD  omeprazole (PRILOSEC) 20 MG capsule Take 20 mg by mouth every morning.    Yes Historical Provider, MD  ranolazine (RANEXA) 1000 MG SR tablet Take 1,000 mg by mouth 2 (two) times daily.   Yes Historical Provider, MD  vitamin E 200 UNIT capsule Take 400 Units by mouth every morning.    Yes Historical Provider, MD    Physical Exam: Filed Vitals:   12/15/15 1445 12/15/15 1500 12/15/15 1545 12/15/15 1656  BP: 166/64 165/69 137/77 172/70  Pulse: 47 53 57 59  Temp:    97.8 F (36.6 C)  TempSrc:      Resp: 17 21 17 18   SpO2: 99% 99% 96% 97%    Constitutional: NAD, calm, comfortable; currently in no acute distress and w/o any dysarthria or acute focal neurologic abnormality  Eyes: PERRLA, lids and conjunctivae normal, no icterus, no nystagmus  ENMT: Mucous membranes are moist. Posterior pharynx clear of any exudate or lesions. Normal dentition.  Neck: normal, supple, no masses, no thyromegaly, no JVD Respiratory: clear to auscultation bilaterally, no wheezing, no crackles. Normal respiratory effort. No accessory muscle use.  Cardiovascular: S1 & S2 heard, regular rate and rhythm, positive SEM, no rubs or gallops. No extremity edema. 2+ pedal pulses.  Abdomen: No distension, no tenderness, no masses palpated. No hepatosplenomegaly. Bowel sounds normal.  Musculoskeletal: no clubbing / cyanosis. No joint deformity upper and lower extremities. Good ROM, no contractures. Normal muscle tone.  Skin: no rashes, lesions, ulcers. No induration. Some small bruises on his arms appreciated Neurologic: CN 2-12 grossly intact. Sensation intact, DTR normal. Strength 5/5 in all 4 limbs. Oriented X3 currently.    Psychiatric: Normal judgment and insight. Normal mood.     Labs on Admission: I have personally reviewed following labs and imaging studies  CBC: No results for input(s): WBC, NEUTROABS, HGB, HCT, MCV, PLT in the last 168 hours. Basic Metabolic Panel:  Recent Labs Lab 12/13/15 1236  CREATININE 1.10   Radiological Exams on Admission: No results found.  EKG:  Right bundle Brach Block (not new according to wife); sinus rhythm and no acute ischemic abnormalities   Assessment/Plan 1-TIA (transient ischemic attack): right hand numbness/tingling and some intermittent episodes of AMS and decrease comprehension to commands. -patient with recent CT angio head and neck (which demonstrated chronic vasculopath disease); also with 2-D echo in the last 3 months, essentially unchanged and w/o signs of emboli or clots. -per neurology rec's, will admit to telemetry, check lipid panel and MRI -will also check TSH and B12 -continue ASA and Brillinta currently -there is concerns of potential  progression of underlying dementia along with right carpal tunnel syndrome -will follow clinical response   2-Alzheimer's disease: -continue supportive care and Namenda  3-Type 2 diabetes mellitus (HCC): -will check A1C -SSI while inpatient -was on diet control at home currently  4-GERD (gastroesophageal reflux disease) -will continue PPI  5-Benign essential HTN: -stable at this point -will continue home antihypertensive regimen   6-Coronary artery disease without angina pectoris: -no CP or SOB -will be admitted to telemetry -continue ASA, statins, Brillinta, Ranexa and B-blockers  7-HLD (hyperlipidemia): -will check lipid panel and continue statins     Time: 65 minutes  DVT prophylaxis: SCD's; patient on ASA and Brillinta. Decline heparin Code Status: DNR/DNI  Family Communication: wife and sister at bedside   Disposition Plan: return back home once work up completed; anticipate 1-2 days  max.  Consults called: neurology   Admission status: observation, telemetry bed, LOS < 2 midnights     Vassie LollMadera, Decorian Schuenemann MD [facility] Pager [phone]  If 7PM-7AM, please contact night-coverage [email or web address] Password [email or web address]  12/15/2015, 6:02 PM

## 2015-12-15 NOTE — ED Notes (Signed)
[facility] woke with right hand/arm weakness and tingling, symptoms resolved on arrival, neuro to see, VSS, no pain and NAD

## 2015-12-15 NOTE — Progress Notes (Signed)
Pt arrived to 5M06 via stretcher.  Pt ambulated from stretcher to bed.  No complaints of pain at this time.  VSS.  Telemetry applied and CCMD notified.  Will continue to monitor. Sondra ComeSilva, Deatra Mcmahen M, RN

## 2015-12-15 NOTE — ED Provider Notes (Addendum)
CSN: [id number]     Arrival date & time 12/15/15  1145 History   First MD Initiated Contact with Patient 12/15/15 1147     Chief Complaint  Patient presents with  . Cerebrovascular Accident     (Consider location/radiation/quality/duration/timing/severity/associated sxs/prior Treatment) HPI Comments: Patient with history of diabetes, coronary artery disease and dementia presents with right arm weakness. He presents as a transfer form [facility] emergency department. He was seen there after he woke up this morning with right arm weakness and numbness. He states she was normal when he went to bed last night. He doesn't remember waking up at all during the night. When he woke up this morning, he noticed that his right arm was weaker and he was having trouble controlling it. He also reported numbness in the right hand. He denies any leg involvement. He denies any vision changes. No speech deficits. He had a head CT that was negative. The ED physician there spoke with our neurologist who recommends transfer to [facility]. He does have a history of a possible TIA 1 month ago.  Patient is a 70 y.o. male presenting with Acute Neurological Problem.  Cerebrovascular Accident Pertinent negatives include no chest pain, no abdominal pain, no headaches and no shortness of breath.    Past Medical History  Diagnosis Date  . Type II or unspecified type diabetes mellitus without mention of complication, not stated as uncontrolled   . Coronary atherosclerosis of unspecified type of vessel, native or graft   . Type II or unspecified type diabetes mellitus with neurological manifestations, not stated as uncontrolled   . Esophageal reflux   . Obesity, unspecified   . Unspecified hereditary and idiopathic peripheral neuropathy   . Disturbance of skin sensation   . Other B-complex deficiencies   . Memory difficulty 04/22/2013  . Alzheimer's disease 11/11/2014   Past Surgical History  Procedure  Laterality Date  . By-pass surgery  2005  . Moles removal  2013   Family History  Problem Relation Age of Onset  . Hypertension Mother   . Alzheimer's disease Mother   . Dementia Mother   . Cancer Brother   . Heart attack Brother   . Kidney disease Father   . Alzheimer's disease Father   . Rheum arthritis Sister    Social History  Substance Use Topics  . Smoking status: Former Games developer  . Smokeless tobacco: None  . Alcohol Use: No    Review of Systems  Constitutional: Negative for fever, chills, diaphoresis and fatigue.  HENT: Negative for congestion, rhinorrhea and sneezing.   Eyes: Negative.   Respiratory: Negative for cough, chest tightness and shortness of breath.   Cardiovascular: Negative for chest pain and leg swelling.  Gastrointestinal: Negative for nausea, vomiting, abdominal pain, diarrhea and blood in stool.  Genitourinary: Negative for frequency, hematuria, flank pain and difficulty urinating.  Musculoskeletal: Negative for back pain and arthralgias.  Skin: Negative for rash.  Neurological: Positive for weakness and numbness. Negative for dizziness, speech difficulty and headaches.      Allergies  Statins; Ambien; Metformin and related; Rosuvastatin calcium; and Zetia  Home Medications   Prior to Admission medications   Medication Sig Start Date End Date Taking? Authorizing Provider  aspirin EC 81 MG tablet Take 81 mg by mouth every morning.   Yes Historical Provider, MD  atorvastatin (LIPITOR) 10 MG tablet Take 5 mg by mouth at bedtime.   Yes Historical Provider, MD  BRILINTA 90 MG TABS tablet Take  90 mg by mouth 2 (two) times daily. 12/08/15  Yes Historical Provider, MD  carvedilol (COREG) 6.25 MG tablet Take 6.25 mg by mouth 2 (two) times daily with a meal.    Yes Historical Provider, MD  co-enzyme Q-10 50 MG capsule Take 200 mg by mouth every morning.    Yes Historical Provider, MD  Cyanocobalamin 2500 MCG TABS Take 1,000 mcg by mouth daily.    Yes  Historical Provider, MD  docusate sodium (COLACE) 100 MG capsule Take 100 mg by mouth 2 (two) times daily.   Yes Historical Provider, MD  fexofenadine (ALLEGRA) 180 MG tablet Take 180 mg by mouth daily as needed for allergies.    Yes Historical Provider, MD  NAMENDA XR 28 MG CP24 24 hr capsule TAKE ONE CAPSULE BY MOUTH DAILY 07/14/15  Yes York Spaniel, MD  nitroGLYCERIN (NITROSTAT) 0.4 MG SL tablet Place 0.4 mg under the tongue every 5 (five) minutes as needed for chest pain.   Yes Historical Provider, MD  Omega-3 Fatty Acids (SALMON OIL-1000) 200 MG CAPS Take by mouth.   Yes Historical Provider, MD  omeprazole (PRILOSEC) 20 MG capsule Take 20 mg by mouth every morning.    Yes Historical Provider, MD  ranolazine (RANEXA) 1000 MG SR tablet Take 1,000 mg by mouth 2 (two) times daily.   Yes Historical Provider, MD  vitamin E 200 UNIT capsule Take 400 Units by mouth every morning.    Yes Historical Provider, MD   BP 182/78 mmHg  Pulse 57  Temp(Src) 98.6 F (37 C) (Oral)  Resp 18  SpO2 96% Physical Exam  Constitutional: He is oriented to person, place, and time. He appears well-developed and well-nourished.  HENT:  Head: Normocephalic and atraumatic.  Eyes: Pupils are equal, round, and reactive to light.  Neck: Normal range of motion. Neck supple.  Cardiovascular: Normal rate, regular rhythm and normal heart sounds.   Pulmonary/Chest: Effort normal and breath sounds normal. No respiratory distress. He has no wheezes. He has no rales. He exhibits no tenderness.  Abdominal: Soft. Bowel sounds are normal. There is no tenderness. There is no rebound and no guarding.  Musculoskeletal: Normal range of motion. He exhibits no edema.  Lymphadenopathy:    He has no cervical adenopathy.  Neurological: He is alert and oriented to person, place, and time.  Motor 5 out of 5 all extremities, sensation slightly diminished to light touch in the right upper extremity but intact and the other extremities.  No facial drooping or slurred speech. Finger-nose intact, no pronator drift  Skin: Skin is warm and dry. No rash noted.  Psychiatric: He has a normal mood and affect.    ED Course  Procedures (including critical care time) Labs Review Labs Reviewed - No data to display  Imaging Review No results found. I have personally reviewed and evaluated these images and lab results as part of my medical decision-making.   EKG Interpretation   Date/Time:  Thursday December 15 2015 11:48:50 EDT Ventricular Rate:  52 PR Interval:  163 QRS Duration: 164 QT Interval:  443 QTC Calculation: 412 R Axis:   -20 Text Interpretation:  Sinus rhythm Right bundle branch block No old  tracing to compare Confirmed by Giovoni Bunch  MD, Kanye Depree ([id number]) on 12/15/2015  12:24:41 PM      MDM   Final diagnoses:  Transient cerebral ischemia, unspecified transient cerebral ischemia type    I reviewed records from [facility]. Head CT was read as negative. Labs show a  daily CBC of 6.2, hemoglobin of 13.4, hematocrit 38.9, platelets 240. Sodium 138, potassium 4.8, chloride 104, BUN and 18, creatinine 1.0, LFTs were within normal limits, INR is 1.0, glucose 157.  Dr. Gasper LloydNandigan to see pt., requests hospitalist to admit.  Consulted unassigned medicine.  Spoke with Dr. Gwenlyn PerkingMadera with [facility] who will admit pt.  Rolan BuccoMelanie Yosiah Jasmin, MD 12/15/15 1551  Rolan BuccoMelanie Gaetan Spieker, MD 12/15/15 [phone]

## 2015-12-16 ENCOUNTER — Observation Stay (HOSPITAL_COMMUNITY): Payer: Medicare Other

## 2015-12-16 DIAGNOSIS — I2510 Atherosclerotic heart disease of native coronary artery without angina pectoris: Secondary | ICD-10-CM | POA: Diagnosis not present

## 2015-12-16 DIAGNOSIS — I10 Essential (primary) hypertension: Secondary | ICD-10-CM | POA: Diagnosis not present

## 2015-12-16 DIAGNOSIS — G309 Alzheimer's disease, unspecified: Secondary | ICD-10-CM | POA: Diagnosis not present

## 2015-12-16 DIAGNOSIS — K219 Gastro-esophageal reflux disease without esophagitis: Secondary | ICD-10-CM | POA: Diagnosis not present

## 2015-12-16 LAB — BASIC METABOLIC PANEL
ANION GAP: 6 (ref 5–15)
BUN: 13 mg/dL (ref 6–20)
CALCIUM: 8.9 mg/dL (ref 8.9–10.3)
CO2: 26 mmol/L (ref 22–32)
Chloride: 104 mmol/L (ref 101–111)
Creatinine, Ser: 1.05 mg/dL (ref 0.61–1.24)
Glucose, Bld: 150 mg/dL — ABNORMAL HIGH (ref 65–99)
Potassium: 4.1 mmol/L (ref 3.5–5.1)
SODIUM: 136 mmol/L (ref 135–145)

## 2015-12-16 LAB — CBC
HCT: 36.9 % — ABNORMAL LOW (ref 39.0–52.0)
Hemoglobin: 12.2 g/dL — ABNORMAL LOW (ref 13.0–17.0)
MCH: 30.7 pg (ref 26.0–34.0)
MCHC: 33.1 g/dL (ref 30.0–36.0)
MCV: 92.7 fL (ref 78.0–100.0)
Platelets: 238 10*3/uL (ref 150–400)
RBC: 3.98 MIL/uL — ABNORMAL LOW (ref 4.22–5.81)
RDW: 13.5 % (ref 11.5–15.5)
WBC: 6.9 10*3/uL (ref 4.0–10.5)

## 2015-12-16 LAB — GLUCOSE, CAPILLARY
GLUCOSE-CAPILLARY: 141 mg/dL — AB (ref 65–99)
GLUCOSE-CAPILLARY: 142 mg/dL — AB (ref 65–99)

## 2015-12-16 LAB — LIPID PANEL
Cholesterol: 150 mg/dL (ref 0–200)
HDL: 27 mg/dL — AB (ref >40)
LDL CALC: 90 mg/dL (ref 0–99)
TRIGLYCERIDES: 165 mg/dL — AB (ref <150)
Total CHOL/HDL Ratio: 5.6 RATIO
VLDL: 33 mg/dL (ref 0–40)

## 2015-12-16 NOTE — Progress Notes (Signed)
   12/16/15 1418  OT Visit Information  Last OT Received On 12/16/15  Assistance Needed +1  History of Present Illness Gregory RuddyVirgil Ortega is a 70 y.o. 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  Precautions  Precautions None  Pain Assessment  Pain Assessment No/denies pain  Cognition  Arousal/Alertness Awake/alert  Behavior During Therapy WFL for tasks assessed/performed  Overall Cognitive Status History of cognitive impairments - at baseline  ADL  General ADL Comments brought photo of tub bench; wife is familiar with this.  She plans to get this.  Educated to buy an inexpensive shower curtain liner and make two slits so that it can be placed between panels to keep water inside of tub.  Wife has a little bit of assistance but would like to talk to a SW for more resources in the community:  RN will pass this along to SW.  Also had conversation about OP OT.  Pt may benefit to progress RUE recovery (non-dominant hand).  Some of difficulty is from cognition.  Pt able to follow HEP when broken down and performed in repetitive sequence. Gave bil [facility] ideas as well (folding laundry, turning cards over, stuffing envelopes. Also gave him squeeze ball and super soft putty)  Exercises  Exercises Other exercises  Other Exercises  Other Exercises performed HEP and written instructions given to wife:  squeeze ball 10xs with RUE, RUE AROM especially opposition, roll putty then perform pinch and 3 chuck jaw grasps.  Wife verbalizes understanding. Pt is no longer able to read  ADL Goals  Pt/caregiver will Perform Home Exercise Program Increased ROM;Increased  strength;Right Upper extremity;With theraputty;With minimal assist;With written HEP provided (and soft theraputty)  Additional ADL Goal #1 pt will perform tub transfer bench transfer with min guard assist and cues  OT G-codes **NOT FOR INPATIENT CLASS**  Self Care Discharge Status ([id number]) CJ  OT General Charges  $OT Visit 1 Procedure  OT Treatments  $Therapeutic Activity 8-22 mins  $Neuromuscular Re-education 8-22 mins  $Therapeutic Exercise 8-22 mins  Marica OtterMaryellen Ameka Krigbaum, OTR/L [phone] 12/16/2015

## 2015-12-16 NOTE — Discharge Summary (Signed)
Physician Discharge Summary  Gregory Ortega ZOX:[id number] DOB: May 14, 1946 DOA: 12/15/2015  PCP: Feliciana Rossetti, MD  Admit date: 12/15/2015 Discharge date: 12/16/2015  Time spent: 35 minutes  Recommendations for Outpatient Follow-up:  Follow pending A1C Repeat BMET to follow electrolytes and renal function Please reassess BP and adjust medications as needed  Reassess need for referral regarding right hand carpal tunnel as recommended by neurology. ?? Initiation of low dose neurontin.  Discharge Diagnoses:  Principal Problem:   TIA (transient ischemic attack) Active Problems:   Alzheimer's disease   Type 2 diabetes mellitus (HCC)   GERD (gastroesophageal reflux disease)   Benign essential HTN   Coronary artery disease without angina pectoris   HLD (hyperlipidemia)   Discharge Condition: stable and improved. No focal neurologic or motor deficit at discharge. Instructed to follow up with PCP in 2 weeks  Diet recommendation: heart healthy and low carb diet   History of present illness:  70 y.o. 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 Symptoms present since he woke this morning and new according to patient (as he went to bed w/o any complaints). Patient denies CP, SOB, nausea, vomiting, HA's, blurred vision, melena, hematuria, dysuria, fever, chills or any other complaints. Wife reported some intermittent episodes of confusion and difficulty comprhending commands over the weekend.  By the time of my evaluation all his symptoms were resolved. Neurology was consulted and the plan was to complete work up for TIA.  Hospital Course:  1-TIA (transient ischemic attack): right hand  numbness/tingling and some intermittent episodes of AMS and decrease comprehension to commands. -patient with recent CT angio head and neck (which demonstrated chronic vasculopath disease); also with 2-D echo in the last 3 months, essentially unchanged and w/o signs of emboli or clots. -per neurology rec's, was admitted to telemetry (no abnormalities seen)' lipid panel stable (will continue statins); MRI (w/o acute stroke or abnormalities)  -TSH and B12 WNL -continue ASA and Brillinta currently -there is concerns of potential progression of underlying dementia along with right carpal tunnel syndrome. Wife expressed having hand split at home, as she use to suffer from carpal tunnel, will empirically use it and follow up with PCP as an outpatient for any further referral need it  2-Alzheimer's disease: -continue supportive care and use of Namenda  3-Type 2 diabetes mellitus (HCC): -A1C pending at discharge -SSI use while inpatient -was on diet control at home currently; advise to follow low carb diet -will follow up with PCP for further decision on initiating hypoglycemic therapy if needed   4-GERD (gastroesophageal reflux disease) -will continue PPI  5-Benign essential HTN: -stable at this point -will continue home antihypertensive regimen  -advise to follow heart healthy diet   6-Coronary artery disease without angina pectoris: -no CP or SOB -no abnormalities appreciated on telemetry -continue ASA, statins, Brillinta, Ranexa and B-blockers -outpatient follow up with cardiology service   7-HLD (hyperlipidemia): -will continue statins  -LDL 90 and TG 165   Procedures:  See below for x-ray reports   Consultations:  Neurology   Discharge Exam: Filed Vitals:   12/16/15 0557 12/16/15 1040  BP: 147/75 129/66  Pulse: 61 57  Temp: 97.5 F (36.4 C) 98.8 F (37.1 C)  Resp: 18 20  General: afebrile, no CP, oriented X3, slight poor insight, but at baseline per wife. No  SOB. No focal neurologic or motor deficit reported or appreciated on exam.   Cardiovascular: S1 and S2, no rubs or gallops, no JVD Respiratory: good air movement, no wheeizng Abd: soft, NT, ND, positive BS Extremities: no edema or cyanosis   Discharge Instructions   Discharge Instructions    Ambulatory referral to Occupational Therapy    Complete by:  As directed      Ambulatory referral to Physical Therapy    Complete by:  As directed      Diet - low sodium heart healthy    Complete by:  As directed      Discharge instructions    Complete by:  As directed   Maintain adequate hydration Follow heart healthy diet and watch amount of carbohydrates Arrange follow up with PCP in 2 weeks Please contcat Dr. Corliss Skains for follow up appointment details          Current Discharge Medication List    CONTINUE these medications which have NOT CHANGED   Details  aspirin EC 81 MG tablet Take 81 mg by mouth every morning.    atorvastatin (LIPITOR) 10 MG tablet Take 5 mg by mouth at bedtime.    BRILINTA 90 MG TABS tablet Take 90 mg by mouth 2 (two) times daily. Refills: 6    carvedilol (COREG) 6.25 MG tablet Take 6.25 mg by mouth 2 (two) times daily with a meal.     co-enzyme Q-10 50 MG capsule Take 200 mg by mouth every morning.     Cyanocobalamin 2500 MCG TABS Take 1,000 mcg by mouth daily.     docusate sodium (COLACE) 100 MG capsule Take 100 mg by mouth 2 (two) times daily.    fexofenadine (ALLEGRA) 180 MG tablet Take 180 mg by mouth daily as needed for allergies.     NAMENDA XR 28 MG CP24 24 hr capsule TAKE ONE CAPSULE BY MOUTH DAILY Qty: 30 capsule, Refills: 6    nitroGLYCERIN (NITROSTAT) 0.4 MG SL tablet Place 0.4 mg under the tongue every 5 (five) minutes as needed for chest pain.    Omega-3 Fatty Acids (SALMON OIL-1000) 200 MG CAPS Take by mouth.    omeprazole (PRILOSEC) 20 MG capsule Take 20 mg by mouth every morning.     ranolazine (RANEXA) 1000 MG SR tablet Take 1,000  mg by mouth 2 (two) times daily.    vitamin E 200 UNIT capsule Take 400 Units by mouth every morning.        Allergies  Allergen Reactions  . Statins Other (See Comments)    Confusion, memory loss and couldn't concentrate   . Ambien [Zolpidem Tartrate] Other (See Comments)    irritability  . Metformin And Related Other (See Comments)    Blood sugar dropped too low   . Rosuvastatin Calcium Other (See Comments)    Confusion, memory loss and couldn't concentrate   . Zetia [Ezetimibe] Other (See Comments)    Blood sugar dropped too low   Follow-up Information    [facility] with [facility].   Specialty:  Rehabilitation   Why:  they will contact you to set up the first appointment. If you have not heard from them in several days you can call the number provided.   Contact information:   [address] [id number] mc [place] [address] [phone]      Follow up with Feliciana Rossetti, MD. Schedule  an appointment as soon as possible for a visit in 2 weeks.   Specialty:  Internal Medicine   Contact information:   [address] [place] [address] [phone]        The results of significant diagnostics from this hospitalization (including imaging, microbiology, ancillary and laboratory) are listed below for reference.    Significant Diagnostic Studies: Ct Angio Head W/cm &/or Wo Cm  12/13/2015  CLINICAL DATA:  70 year old male with multifocal right ICA and left supraclinoid ICA atherosclerosis and stenosis. Intermittent left side numbness. Subsequent encounter. EXAM: CT ANGIOGRAPHY HEAD AND NECK TECHNIQUE: Multidetector CT imaging of the head and neck was performed using the standard protocol during bolus administration of intravenous contrast. Multiplanar CT image reconstructions and MIPs were obtained to evaluate the vascular anatomy. Carotid stenosis measurements (when applicable) are obtained utilizing NASCET  criteria, using the distal internal carotid diameter as the denominator. CONTRAST:  100 mL Omnipaque 350 COMPARISON:  Cerebral angiogram 05/17/2015. Neck CTA 03/17/2015. Head CT without contrast 03/26/2015 FINDINGS: CT HEAD Brain: Stable cerebral volume. No midline shift, ventriculomegaly, mass effect, evidence of mass lesion, intracranial hemorrhage or evidence of cortically based acute infarction. Stable gray-white matter differentiation throughout the brain. Calvarium and skull base:  No acute osseous abnormality identified. Paranasal sinuses: Visualized paranasal sinuses and mastoids are clear. Orbits: Negative orbit and scalp soft tissues. CTA NECK Skeleton: Prior median sternotomy. No acute osseous abnormality identified. Other neck: Sequelae of CABG. No superior mediastinal lymphadenopathy. Stable, negative lung apices. Thyroid, larynx, pharynx, parapharyngeal spaces, retropharyngeal space, sublingual space, submandibular glands, and parotid glands are stable and negative. No cervical lymphadenopathy. Aortic arch: 4 vessel arch configuration with aberrant origin of the right subclavian artery re- demonstrated. Stable mild mostly soft arch atherosclerotic plaque with no great vessel origin stenosis. Right carotid system: Soft and calcified plaque at the right carotid bifurcation involving the right ICA origin and bulb with less than 50 % stenosis with respect to the distal vessel at the right ICA origin. Left carotid system: Stable soft and calcified plaque at the left ICA origin and bulb with less than 50 % stenosis with respect to the distal vessel. Vertebral arteries:Dominant left vertebral artery re- demonstrated. Left vertebral artery origin appears normal. Intermittent left vertebral artery tortuosity in the neck with no stenosis. Non dominant right vertebral artery origin appears heavily calcified as before with appearance of high-grade origin stenosis stable by CTA. The right vertebral artery is  diminutive but appears stable and remains patent to the skullbase. CTA HEAD Posterior circulation: Severe irregularity and stenosis of the right vertebral artery V4 segment which by angiogram appeared occluded, such that the right vertebral artery functionally terminated outside the skull. Dominant distal left vertebral artery is stable with mild to moderate irregularity at the level of the left PICA origin which remains patent. Subsequent mild left V4 segment stenosis. The left vertebral artery supplies the basilar. Multifocal basilar artery irregularity without stenosis. Patent SCA origins. Right PCA origin within normal limits. Fetal type left PCA origin. Both the left posterior communicating artery and right PCA P2 segment demonstrate moderate to severe irregularity and stenosis. Superimposed moderate irregularity and stenosis also in the distal right PCA branches (series 16, image 15), whereas the distal left PCA branches appear within normal limits. Right posterior communicating artery is diminutive or absent. Anterior circulation: Both ICA siphons remain patent and are heavily calcified. Moderate to severe bilateral supraclinoid ICA stenosis appears stable from the prior studies and appears most severe in the  right supraclinoid segment related to calcified plaque as seen on series 13, image 79 today. Similar moderate to severe right ICA distal petrous segment stenosis is stable and best seen on series 11, image 106. Both carotid termini remain patent. Left MCA and left ACA origins are within normal limits. There is mild irregularity and stenosis at the right ACA and right MCA origins. Anterior communicating artery and proximal ACA A2 segments are within normal limits. There is mild to moderate irregularity and stenosis in the right ACA at the level of the right callosomarginal artery best best seen on series 16, image 18. Left MCA M1 segment and left MCA bifurcation are patent and within normal limits. There  is high-grade stenosis at the origin and proximal 5 mm segment of the dominant posterior right MCA M2 branch best seen on series 13, image 16. Beyond its origin the right MCA M1 segment and right MCA bifurcation are within normal limits. There is mild right MCA branch irregularity. Venous sinuses: Patent. Anatomic variants: Aberrant origin of the right subclavian artery. Dominant left vertebral artery. Fetal type left PCA origin. Delayed phase: No abnormal enhancement identified. IMPRESSION: 1. Stable head and neck arterial findings since 05/17/2015, notable for atherosclerotic related hemodynamically significant stenosis at the: - Right ICA siphon (distal petrous and supraclinoid segments), - Left ICA siphon (supraclinoid segment), - Right MCA posterior M2 branch origin, - non dominant Right Vertebral artery origin (which appears chronically occluded Intracranially), - Right PCA P2 segment and fetal type Left Pcomm PCA origin, - distal Right PCA branches. 2. No acute intracranial abnormality identified. Stable CT appearance of the brain. 3. Stable CT appearance of the neck. Electronically Signed   By: Odessa Fleming M.D.   On: 12/13/2015 15:58   Dg Chest 2 View  12/16/2015  CLINICAL DATA:  TIA EXAM: CHEST  2 VIEW COMPARISON:  12/15/2015 FINDINGS: The heart size and vascular pattern normal. Status post CABG. Lungs are clear. No pleural effusion. IMPRESSION: No active cardiopulmonary disease. Electronically Signed   By: Esperanza Heir M.D.   On: 12/16/2015 07:05   Ct Angio Neck W/cm &/or Wo/cm  12/13/2015  CLINICAL DATA:  70 year old male with multifocal right ICA and left supraclinoid ICA atherosclerosis and stenosis. Intermittent left side numbness. Subsequent encounter. EXAM: CT ANGIOGRAPHY HEAD AND NECK TECHNIQUE: Multidetector CT imaging of the head and neck was performed using the standard protocol during bolus administration of intravenous contrast. Multiplanar CT image reconstructions and MIPs were obtained to  evaluate the vascular anatomy. Carotid stenosis measurements (when applicable) are obtained utilizing NASCET criteria, using the distal internal carotid diameter as the denominator. CONTRAST:  100 mL Omnipaque 350 COMPARISON:  Cerebral angiogram 05/17/2015. Neck CTA 03/17/2015. Head CT without contrast 03/26/2015 FINDINGS: CT HEAD Brain: Stable cerebral volume. No midline shift, ventriculomegaly, mass effect, evidence of mass lesion, intracranial hemorrhage or evidence of cortically based acute infarction. Stable gray-white matter differentiation throughout the brain. Calvarium and skull base:  No acute osseous abnormality identified. Paranasal sinuses: Visualized paranasal sinuses and mastoids are clear. Orbits: Negative orbit and scalp soft tissues. CTA NECK Skeleton: Prior median sternotomy. No acute osseous abnormality identified. Other neck: Sequelae of CABG. No superior mediastinal lymphadenopathy. Stable, negative lung apices. Thyroid, larynx, pharynx, parapharyngeal spaces, retropharyngeal space, sublingual space, submandibular glands, and parotid glands are stable and negative. No cervical lymphadenopathy. Aortic arch: 4 vessel arch configuration with aberrant origin of the right subclavian artery re- demonstrated. Stable mild mostly soft arch atherosclerotic plaque with no great vessel origin  stenosis. Right carotid system: Soft and calcified plaque at the right carotid bifurcation involving the right ICA origin and bulb with less than 50 % stenosis with respect to the distal vessel at the right ICA origin. Left carotid system: Stable soft and calcified plaque at the left ICA origin and bulb with less than 50 % stenosis with respect to the distal vessel. Vertebral arteries:Dominant left vertebral artery re- demonstrated. Left vertebral artery origin appears normal. Intermittent left vertebral artery tortuosity in the neck with no stenosis. Non dominant right vertebral artery origin appears heavily  calcified as before with appearance of high-grade origin stenosis stable by CTA. The right vertebral artery is diminutive but appears stable and remains patent to the skullbase. CTA HEAD Posterior circulation: Severe irregularity and stenosis of the right vertebral artery V4 segment which by angiogram appeared occluded, such that the right vertebral artery functionally terminated outside the skull. Dominant distal left vertebral artery is stable with mild to moderate irregularity at the level of the left PICA origin which remains patent. Subsequent mild left V4 segment stenosis. The left vertebral artery supplies the basilar. Multifocal basilar artery irregularity without stenosis. Patent SCA origins. Right PCA origin within normal limits. Fetal type left PCA origin. Both the left posterior communicating artery and right PCA P2 segment demonstrate moderate to severe irregularity and stenosis. Superimposed moderate irregularity and stenosis also in the distal right PCA branches (series 16, image 15), whereas the distal left PCA branches appear within normal limits. Right posterior communicating artery is diminutive or absent. Anterior circulation: Both ICA siphons remain patent and are heavily calcified. Moderate to severe bilateral supraclinoid ICA stenosis appears stable from the prior studies and appears most severe in the right supraclinoid segment related to calcified plaque as seen on series 13, image 79 today. Similar moderate to severe right ICA distal petrous segment stenosis is stable and best seen on series 11, image 106. Both carotid termini remain patent. Left MCA and left ACA origins are within normal limits. There is mild irregularity and stenosis at the right ACA and right MCA origins. Anterior communicating artery and proximal ACA A2 segments are within normal limits. There is mild to moderate irregularity and stenosis in the right ACA at the level of the right callosomarginal artery best best seen  on series 16, image 18. Left MCA M1 segment and left MCA bifurcation are patent and within normal limits. There is high-grade stenosis at the origin and proximal 5 mm segment of the dominant posterior right MCA M2 branch best seen on series 13, image 16. Beyond its origin the right MCA M1 segment and right MCA bifurcation are within normal limits. There is mild right MCA branch irregularity. Venous sinuses: Patent. Anatomic variants: Aberrant origin of the right subclavian artery. Dominant left vertebral artery. Fetal type left PCA origin. Delayed phase: No abnormal enhancement identified. IMPRESSION: 1. Stable head and neck arterial findings since 05/17/2015, notable for atherosclerotic related hemodynamically significant stenosis at the: - Right ICA siphon (distal petrous and supraclinoid segments), - Left ICA siphon (supraclinoid segment), - Right MCA posterior M2 branch origin, - non dominant Right Vertebral artery origin (which appears chronically occluded Intracranially), - Right PCA P2 segment and fetal type Left Pcomm PCA origin, - distal Right PCA branches. 2. No acute intracranial abnormality identified. Stable CT appearance of the brain. 3. Stable CT appearance of the neck. Electronically Signed   By: Odessa Fleming M.D.   On: 12/13/2015 15:58   Mr Brain Wo Contrast  12/16/2015  CLINICAL DATA:  Initial evaluation for acute right arm weakness and numbness. EXAM: MRI HEAD WITHOUT CONTRAST TECHNIQUE: Multiplanar, multiecho pulse sequences of the brain and surrounding structures were obtained without intravenous contrast. COMPARISON:  Prior CT from 12/15/2015. Comparison also made with previous brain MRI from 05/08/2013. FINDINGS: Mild diffuse prominence of the CSF containing spaces is compatible with generalized cerebral atrophy. Patchy and confluent T2/FLAIR hyperintensity within the periventricular, deep, and subcortical white matter of both cerebral hemispheres present, nonspecific, but most likely related to  chronic small vessel ischemic disease. This is mild to moderate in nature for patient age. Mild chronic small vessel ischemic type changes present within the pons. No abnormal foci of restricted diffusion to suggest acute intracranial infarct. Gray-white matter differentiation maintained. Major intracranial vascular flow voids are preserved. No acute intracranial hemorrhage. Few tiny punctate foci susceptibility artifact present within the right parietal lobe, likely small chronic micro hemorrhages, most commonly related to chronic underlying hypertension. No mass lesion, midline shift or mass effect. Mild ventricular prominence related to global parenchymal volume loss without hydrocephalus. No extra-axial fluid collection. Major dural sinuses are grossly patent. Craniocervical junction normal. Mild degenerative spondylolysis within the visualized upper cervical spine without stenosis. Incidental note made of a partially empty sella. No acute abnormality about the orbits. Mild mucosal thickening within the paranasal sinuses. No air-fluid levels to suggest active sinus infection. Trace opacity within the left mastoid air cells. Right mastoid air cells clear. Inner ear structures normal. Bone marrow signal intensity within normal limits. No scalp soft tissue abnormality. IMPRESSION: 1. No acute intracranial infarct or other process identified. 2. Generalized age-related cerebral atrophy with mild to moderate chronic small vessel ischemic disease, mildly progressed relative to 2014. Electronically Signed   By: Rise MuBenjamin  McClintock M.D.   On: 12/16/2015 05:52    Labs: Basic Metabolic Panel:  Recent Labs Lab 12/13/15 1236 12/15/15 1821 12/16/15 0640  NA  --  134* 136  K  --  3.7 4.1  CL  --  104 104  CO2  --  23 26  GLUCOSE  --  144* 150*  BUN  --  14 13  CREATININE 1.10 0.99 1.05  CALCIUM  --  9.0 8.9  MG  --  2.0  --   PHOS  --  3.4  --    Liver Function Tests:  Recent Labs Lab 12/15/15 1821   AST 16  ALT 15*  ALKPHOS 55  BILITOT 1.1  PROT 6.1*  ALBUMIN 3.5   CBC:  Recent Labs Lab 12/16/15 0640  WBC 6.9  HGB 12.2*  HCT 36.9*  MCV 92.7  PLT 238   CBG:  Recent Labs Lab 12/15/15 1824 12/15/15 2121 12/16/15 0622 12/16/15 1122  GLUCAP 141* 158* 141* 142*    Signed:  Vassie LollMadera, Nissan Frazzini MD.  [facility] 12/16/2015, 4:14 PM

## 2015-12-16 NOTE — Progress Notes (Signed)
Pt discharged home with wife. IV removed and telemetry discontinued. Discharge instructions reviewed. Pt questions asked and answered. Will leave unit via wheelchair and transport home with spouse. Lawson RadarHeather M Ever Gustafson

## 2015-12-16 NOTE — Care Management Note (Signed)
Case Management Note  Patient Details  Name: Gregory Ortega MRN: [id number] Date of Birth: 03-26-1946  Subjective/Objective:     Pt in with TIA. MRI results negative. He is from home with spouse.               Action/Plan: Awaiting PT/OT recs. CM following for discharge disposition.   Expected Discharge Date:                  Expected Discharge Plan:     In-House Referral:     Discharge planning Services     Post Acute Care Choice:    Choice offered to:     DME Arranged:    DME Agency:     HH Arranged:    HH Agency:     Status of Service:  In process, will continue to follow  Medicare Important Message Given:    Date Medicare IM Given:    Medicare IM give by:    Date Additional Medicare IM Given:    Additional Medicare Important Message give by:     If discussed at Long Length of Stay Meetings, dates discussed:    Additional Comments:  Kermit BaloKelli F Gwenneth Whiteman, RN 12/16/2015, 11:12 AM

## 2015-12-16 NOTE — Evaluation (Signed)
Physical Therapy Evaluation Patient Details Name: Gregory Ortega MRN: [id number] DOB: [birth year] Today's Date: 12/16/2015   History of Present Illness  Gregory Ortega is a 70 y.o. 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  Clinical Impression  Patient presents with mobility close to baseline.  Seems to have had increased cognitive decline this year.  Feel follow up multidisciplinary therapy indicated to address high level balance, fine motor and cognitive issues.  No further acute PT needs, but will benefit from follow up outpatient PT/OT/SLP at [facility].     Follow Up Recommendations Outpatient PT    Equipment Recommendations  None recommended by PT    Recommendations for Other Services       Precautions / Restrictions Precautions Precautions: None Restrictions Weight Bearing Restrictions: No      Mobility  Bed Mobility Overal bed mobility: Independent                Transfers Overall transfer level: Independent                  Ambulation/Gait Ambulation/Gait assistance: Independent Ambulation Distance (Feet): 250 Feet Assistive device: None Gait Pattern/deviations: WFL(Within Functional Limits)        Stairs Stairs: Yes Stairs assistance: Supervision Stair Management: Alternating pattern;Step to pattern;Forwards Number of Stairs: 3 General stair comments: up with step through pattern, at times step to versus step through for descent; one lateral LOB on steps due to not using railing (wife reports almost fell going up steps at home carrying groceries due to not stepping up high enough;  considering a railing)  Wheelchair Mobility    Modified Rankin (Stroke Patients Only)       Balance                                 Standardized Balance Assessment Standardized Balance Assessment : Dynamic Gait Index   Dynamic Gait Index Level Surface: Normal Change in Gait Speed: Normal Gait with Horizontal Head Turns: Normal Gait with Vertical Head Turns: Normal Gait and Pivot Turn: Normal Step Over Obstacle: Normal Step Around Obstacles: Moderate Impairment (needed cues) Steps: Moderate Impairment Total Score: 20       Pertinent Vitals/Pain Pain Assessment: No/denies pain    Home Living Family/patient expects to be discharged to:: Private residence   Available Help at Discharge: Family;Available 24 hours/day Type of Home: House Home Access: Stairs to enter Entrance Stairs-Rails: None Entrance Stairs-Number of Steps: 2 Home Layout: Two level;Laundry or work area in basement (only half basement) Home Equipment: None      Prior Function Level of Independence: Independent         Comments: 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; hasn't mowed the grass since had stents put in; does walk daily at the mall; enjoys his cats     Hand Dominance   Dominant Hand: Left    Extremity/Trunk Assessment   Upper Extremity Assessment: RUE deficits/detail RUE Deficits / Details: decreased grip as compared to R; demonstrates tightness in finger extensors (has to extend wrist to make a fist); difficulty with fine motor task: touching each finger  to thumb (partly due to cognition of task)         Lower Extremity Assessment: Overall WFL for tasks assessed      Cervical / Trunk Assessment: Other exceptions  Communication   Communication: No difficulties  Cognition Arousal/Alertness: Awake/alert Behavior During Therapy: WFL for tasks assessed/performed Overall Cognitive Status: History of cognitive impairments - at baseline        Memory: Decreased short-term memory              General Comments      Exercises        Assessment/Plan    PT Assessment All further PT needs can be met in the next venue of care  PT Diagnosis Abnormality of gait   PT Problem List Decreased balance;Decreased strength;Decreased safety awareness  PT Treatment Interventions     PT Goals (Current goals can be found in the Care Plan section) Acute Rehab PT Goals PT Goal Formulation: All assessment and education complete, DC therapy    Frequency     Barriers to discharge        Co-evaluation               End of Session Equipment Utilized During Treatment: Gait belt Activity Tolerance: Patient tolerated treatment well Patient left: in chair;with call bell/phone within reach;with family/visitor present      Functional Assessment Tool Used: Clinical Judgement Functional Limitation: Mobility: Walking and moving around Mobility: Walking and Moving Around Current Status (V9563): At least 1 percent but less than 20 percent impaired, limited or restricted Mobility: Walking and Moving Around Goal Status [phone]): At least 1 percent but less than 20 percent impaired, limited or restricted Mobility: Walking and Moving Around Discharge Status [phone]): At least 1 percent but less than 20 percent impaired, limited or restricted    Time: 0840-0904 PT Time Calculation (min) (ACUTE ONLY): 24 min   Charges:   PT Evaluation $PT Eval Moderate Complexity: 1 Procedure PT Treatments $Gait Training: 8-22 mins   PT G Codes:   PT G-Codes **NOT FOR INPATIENT CLASS** Functional Assessment Tool Used: Clinical Judgement Functional Limitation: Mobility: Walking and moving around Mobility: Walking and Moving Around Current Status (J8841): At least 1 percent but less than 20 percent impaired, limited or restricted Mobility: Walking and Moving Around Goal Status [phone]): At least 1 percent but less than 20 percent impaired, limited or  restricted Mobility: Walking and Moving Around Discharge Status [phone]): At least 1 percent but less than 20 percent impaired, limited or restricted    Reginia Naas 12/16/2015, 9:28 AM  Magda Kiel, PT [phone] 12/16/2015

## 2015-12-16 NOTE — Care Management Obs Status (Signed)
MEDICARE OBSERVATION STATUS NOTIFICATION   Patient Details  Name: Gregory Ortega MRN: [id number] Date of Birth: October 18, 1945   Medicare Observation Status Notification Given:  Yes (MRI negative)    Kermit BaloKelli F Jakaiya Netherland, RN 12/16/2015, 2:47 PM

## 2015-12-16 NOTE — Evaluation (Addendum)
Occupational Therapy Evaluation Patient Details Name: Gregory Ortega MRN: [id number] DOB: 12/03/1945 Today's Date: 12/16/2015    History of Present Illness Gregory Ortega is a 70 y.o. 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   Clinical Impression   Pt was seen for initial evaluation.  Pt near baseline except for RUE weakness, which is his non-dominant hand.  He also was off balance when stepping over simulated tub.  Recommend wife get a tub bench for increased safety. Will provide HEP    Follow Up Recommendations  Outpatient OT;Supervision/Assistance - 24 hour    Equipment Recommendations   (tub bench--out of pocket expense, family can get themselves)    Recommendations for Other Services       Precautions / Restrictions Precautions Precautions: None      Mobility Bed Mobility Overal bed mobility: Independent                Transfers Overall transfer level: Independent                    Balance                                          ADL Overall ADL's : Needs assistance/impaired                                       General ADL Comments: pt is overall min A  for adls except for fasteners, for whic recently needed help at baseline.  Wife has been helping.  Simulated stepping over tub and this was challenging: pt requires min A.  Previously, he had stood in tub.  Recommended tub bench and assistance to get in and out.     Vision     Perception     Praxis      Pertinent Vitals/Pain Pain Assessment: No/denies pain     Hand Dominance Left    Extremity/Trunk Assessment Upper Extremity Assessment Upper Extremity Assessment: RUE deficits/detail RUE Deficits / Details: weakness:  grossly 3 to 3+/5.  second digit lags into full flexion  He is L hand dominant.  Pt agreeable to ball/putty to strengthen.  Has full ROM .  Pt c/o decreased sensation on R ulnar side of hand, this is impaired but not absent.  Encouraged bil UE activities which he normally performs with no sharp objects and to watch hand during activities. Did not introduce button aide--as it is new learning. Pt was unable to tie a bow with gown straps on his lap (which is out of context)          Communication Communication Communication: No difficulties   Cognition Arousal/Alertness: Awake/alert Behavior During Therapy: WFL for tasks assessed/performed Overall Cognitive Status: History of cognitive impairments - at baseline                     General Comments       Exercises       Shoulder Instructions  Home Living Family/patient expects to be discharged to:: Private residence Living Arrangements: Spouse/significant other Available Help at Discharge: Family;Available 24 hours/day               Bathroom Shower/Tub: Chief Strategy OfficerTub/shower unit   Bathroom Toilet: Standard     Home Equipment: None          Prior Functioning/Environment Level of Independence: Needs assistance        Comments: assist for buttons and tying shoes    OT Diagnosis: Generalized weakness   OT Problem List: Decreased strength;Decreased activity tolerance;Impaired balance (sitting and/or standing);Decreased cognition;Decreased coordination;Impaired sensation   OT Treatment/Interventions: Self-care/ADL training;DME and/or AE instruction;Balance training;Patient/family education;Therapeutic activities;Therapeutic exercise    OT Goals(Current goals can be found in the care plan section) Acute Rehab OT Goals Patient Stated Goal: none stated OT Goal Formulation: With  family Time For Goal Achievement: 12/23/15 Potential to Achieve Goals: Good ADL Goals Pt/caregiver will Perform Home Exercise Program: Increased ROM;Increased strength;Right Upper extremity;With theraputty;With minimal assist;With written HEP provided (and soft theraputty) Additional ADL Goal #1: pt will perform tub transfer bench transfer with min guard assist and cues  OT Frequency: Min 2X/week   Barriers to D/C:            Co-evaluation              End of Session    Activity Tolerance: Patient tolerated treatment well Patient left: in bed;with call bell/phone within reach;with bed alarm set   Time: 1150-1211 OT Time Calculation (min): 21 min Charges:  OT General Charges $OT Visit: 1 Procedure OT Evaluation $OT Eval Moderate Complexity: 1 Procedure G-Codes: OT G-codes **NOT FOR INPATIENT CLASS** Functional Assessment Tool Used: clinical judgment and observation Functional Limitation: Self care Self Care Current Status (R6045(G8987): At least 20 percent but less than 40 percent impaired, limited or restricted Self Care Goal Status (W0981(G8988): At least 20 percent but less than 40 percent impaired, limited or restricted Self Care Discharge Status [phone](G8989): At least 20 percent but less than 40 percent impaired, limited or restricted  Gregory Ortega 12/16/2015, 2:19 PM Gregory Ortega, OTR/L [phone] 12/16/2015

## 2015-12-17 LAB — HEMOGLOBIN A1C
Hgb A1c MFr Bld: 7.2 % — ABNORMAL HIGH (ref 4.8–5.6)
Mean Plasma Glucose: 160 mg/dL

## 2015-12-19 ENCOUNTER — Encounter: Payer: Self-pay | Admitting: Neurology

## 2015-12-19 ENCOUNTER — Other Ambulatory Visit (HOSPITAL_COMMUNITY): Payer: Self-pay | Admitting: Interventional Radiology

## 2015-12-19 ENCOUNTER — Ambulatory Visit (INDEPENDENT_AMBULATORY_CARE_PROVIDER_SITE_OTHER): Payer: Medicare Other | Admitting: Neurology

## 2015-12-19 VITALS — BP 142/86 | HR 60 | Ht 66.0 in | Wt 191.5 lb

## 2015-12-19 DIAGNOSIS — G309 Alzheimer's disease, unspecified: Secondary | ICD-10-CM

## 2015-12-19 DIAGNOSIS — R200 Anesthesia of skin: Secondary | ICD-10-CM

## 2015-12-19 DIAGNOSIS — F0280 Dementia in other diseases classified elsewhere without behavioral disturbance: Secondary | ICD-10-CM | POA: Diagnosis not present

## 2015-12-19 DIAGNOSIS — R413 Other amnesia: Secondary | ICD-10-CM

## 2015-12-19 DIAGNOSIS — I771 Stricture of artery: Secondary | ICD-10-CM

## 2015-12-19 DIAGNOSIS — R202 Paresthesia of skin: Secondary | ICD-10-CM

## 2015-12-19 NOTE — Progress Notes (Signed)
Reason for visit: Right hand numbness, memory disturbance  Gregory Ortega is an 70 y.o. male  History of present illness:  Gregory Ortega is a 70 year old left-handed white male with a history of a progressive memory disturbance felt related to Alzheimer's disease. The patient also has cerebrovascular disease with some stenosis of the carotid arteries bilaterally, and evidence of intracranial disease. The patient was admitted to the hospital around 12/16/2015 with onset of right hand numbness and weakness. The patient underwent MRI brain evaluation that did not show an acute stroke. He underwent CT angiogram of the head and neck, no large vessel or medium vessel occlusions were seen. The patient has been discharged, he has undergone some occupational therapy with the right hand, but the wife believes this has made his weakness of the hand worse. The patient denies any neck pain or arm discomfort, he has a prior history of cervical spondylosis. He also has a history of carpal tunnel syndrome. He returns to this office for an evaluation. His cognitive issues have continued to decline, he is no longer performing yard work. He does help some around the house, but he will get confused frequently with how to do things. He remains on Namenda.  Past Medical History  Diagnosis Date  . Type II or unspecified type diabetes mellitus without mention of complication, not stated as uncontrolled   . Coronary atherosclerosis of unspecified type of vessel, native or graft   . Type II or unspecified type diabetes mellitus with neurological manifestations, not stated as uncontrolled   . Esophageal reflux   . Obesity, unspecified   . Unspecified hereditary and idiopathic peripheral neuropathy   . Disturbance of skin sensation   . Other B-complex deficiencies   . Memory difficulty 04/22/2013  . Alzheimer's disease 11/11/2014    Past Surgical History  Procedure Laterality Date  . By-pass surgery  2005  . Moles  removal  2013  . Coronary stent placement  2017    Family History  Problem Relation Age of Onset  . Hypertension Mother   . Alzheimer's disease Mother   . Dementia Mother   . Cancer Brother   . Heart attack Brother   . Kidney disease Father   . Alzheimer's disease Father   . Rheum arthritis Sister     Social history:  reports that he has quit smoking. He does not have any smokeless tobacco history on file. He reports that he does not drink alcohol or use illicit drugs.    Allergies  Allergen Reactions  . Statins Other (See Comments)    Confusion, memory loss and couldn't concentrate   . Ambien [Zolpidem Tartrate] Other (See Comments)    irritability  . Metformin And Related Other (See Comments)    Blood sugar dropped too low   . Rosuvastatin Calcium Other (See Comments)    Confusion, memory loss and couldn't concentrate   . Zetia [Ezetimibe] Other (See Comments)    Blood sugar too high    Medications:  Prior to Admission medications   Medication Sig Start Date End Date Taking? Authorizing Provider  aspirin EC 81 MG tablet Take 81 mg by mouth every morning.    Historical Provider, MD  atorvastatin (LIPITOR) 10 MG tablet Take 5 mg by mouth at bedtime.    Historical Provider, MD  BRILINTA 90 MG TABS tablet Take 90 mg by mouth 2 (two) times daily. 12/08/15   Historical Provider, MD  carvedilol (COREG) 6.25 MG tablet Take 6.25 mg by mouth  2 (two) times daily with a meal.     Historical Provider, MD  co-enzyme Q-10 50 MG capsule Take 200 mg by mouth every morning.     Historical Provider, MD  Cyanocobalamin 2500 MCG TABS Take 1,000 mcg by mouth daily.     Historical Provider, MD  docusate sodium (COLACE) 100 MG capsule Take 100 mg by mouth 2 (two) times daily.    Historical Provider, MD  fexofenadine (ALLEGRA) 180 MG tablet Take 180 mg by mouth daily as needed for allergies.     Historical Provider, MD  NAMENDA XR 28 MG CP24 24 hr capsule TAKE ONE CAPSULE BY MOUTH DAILY 07/14/15    York Spaniel, MD  nitroGLYCERIN (NITROSTAT) 0.4 MG SL tablet Place 0.4 mg under the tongue every 5 (five) minutes as needed for chest pain.    Historical Provider, MD  Omega-3 Fatty Acids (SALMON OIL-1000) 200 MG CAPS Take by mouth.    Historical Provider, MD  omeprazole (PRILOSEC) 20 MG capsule Take 20 mg by mouth every morning.     Historical Provider, MD  ranolazine (RANEXA) 1000 MG SR tablet Take 1,000 mg by mouth 2 (two) times daily.    Historical Provider, MD  vitamin E 200 UNIT capsule Take 400 Units by mouth every morning.     Historical Provider, MD    ROS:  Out of a complete 14 system review of symptoms, the patient complains only of the following symptoms, and all other reviewed systems are negative.  Shortness of breath Bruising easily, anemia Memory loss, numbness of the hand  Blood pressure 142/86, pulse 60, height  (1.676 m), weight 191 lb 8 oz (86.864 kg).  Physical Exam  General: The patient is alert and cooperative at the time of the examination.  Skin: No significant peripheral edema is noted.   Neurologic Exam  Mental status: The patient is alert and oriented x 2 at the time of the examination (not oriented to date). The Mini-Mental Status Examination done today shows a total score of 14/30.   Cranial nerves: Facial symmetry is present. Speech is normal, no aphasia or dysarthria is noted. Extraocular movements are full. Visual fields are full.  Motor: The patient has good strength in all 4 extremities, with exception of some mild weakness of the intrinsic muscles of the right hand with abduction of the fingers, APB muscle, and finger flexion and extension.  Sensory examination: Soft touch sensation is symmetric on the face, arms, and legs.  Coordination: The patient has good finger-nose-finger and heel-to-shin bilaterally. The patient does have apraxia with the use of the extremities.  Gait and station: The patient has a normal gait. Tandem gait is  normal. Romberg is negative. No drift is seen.  Reflexes: Deep tendon reflexes are symmetric.   CTA head and neck 12/13/15:  IMPRESSION: 1. Stable head and neck arterial findings since 05/17/2015, notable for atherosclerotic related hemodynamically significant stenosis at the: - Right ICA siphon (distal petrous and supraclinoid segments), - Left ICA siphon (supraclinoid segment), - Right MCA posterior M2 branch origin, - non dominant Right Vertebral artery origin (which appears chronically occluded Intracranially), - Right PCA P2 segment and fetal type Left Pcomm PCA origin, - distal Right PCA branches. 2. No acute intracranial abnormality identified. Stable CT appearance of the brain. 3. Stable CT appearance of the neck.   MRI brain 12/16/15:  IMPRESSION: 1. No acute intracranial infarct or other process identified. 2. Generalized age-related cerebral atrophy with mild to moderate chronic small  vessel ischemic disease, mildly progressed relative to 2014.  * MRI scan images were reviewed online. I agree with the written report.    Assessment/Plan:  1. Right hand numbness and weakness  2. Progressive memory disturbance, Alzheimer's disease  The patient has new onset of right hand numbness and weakness. He is not able to use the hand properly. MRI of the brain did not show a stroke, but it may be possible that a small stroke could have been missed. The patient has no pain in the neck or shoulder or down the right arm, but a neuropathy or radiculopathy does need to be considered. Nerve conduction study will be done on both upper extremities, EMG will be done on the right arm. Depending upon the results of the above, we may consider MRI evaluation of the cervical spine. The memory disturbance appears to be relatively stable by Mini-Mental Status Examination.  Marlan Palau MD 12/19/2015 8:23 PM  [facility] [address] [place], [place]  [address]  Phone [phone] Fax [phone]

## 2015-12-22 ENCOUNTER — Ambulatory Visit (HOSPITAL_COMMUNITY)
Admission: RE | Admit: 2015-12-22 | Discharge: 2015-12-22 | Disposition: A | Discharge status: Home / Self Care | Payer: Medicare Other | Source: Ambulatory Visit | Attending: Interventional Radiology | Admitting: Interventional Radiology

## 2015-12-22 DIAGNOSIS — I771 Stricture of artery: Secondary | ICD-10-CM

## 2015-12-26 ENCOUNTER — Other Ambulatory Visit (HOSPITAL_COMMUNITY): Payer: Self-pay | Admitting: Interventional Radiology

## 2015-12-26 DIAGNOSIS — I771 Stricture of artery: Secondary | ICD-10-CM

## 2015-12-28 ENCOUNTER — Other Ambulatory Visit: Payer: Self-pay | Admitting: Radiology

## 2015-12-29 ENCOUNTER — Ambulatory Visit (HOSPITAL_COMMUNITY)
Admission: RE | Admit: 2015-12-29 | Discharge: 2015-12-29 | Disposition: A | Discharge status: Home / Self Care | Payer: Medicare Other | Source: Ambulatory Visit | Attending: Interventional Radiology | Admitting: Interventional Radiology

## 2015-12-29 ENCOUNTER — Encounter (HOSPITAL_COMMUNITY): Payer: Self-pay

## 2015-12-29 DIAGNOSIS — I6523 Occlusion and stenosis of bilateral carotid arteries: Secondary | ICD-10-CM | POA: Diagnosis present

## 2015-12-29 DIAGNOSIS — E669 Obesity, unspecified: Secondary | ICD-10-CM | POA: Diagnosis not present

## 2015-12-29 DIAGNOSIS — Z951 Presence of aortocoronary bypass graft: Secondary | ICD-10-CM | POA: Diagnosis not present

## 2015-12-29 DIAGNOSIS — K219 Gastro-esophageal reflux disease without esophagitis: Secondary | ICD-10-CM | POA: Insufficient documentation

## 2015-12-29 DIAGNOSIS — I6501 Occlusion and stenosis of right vertebral artery: Secondary | ICD-10-CM | POA: Diagnosis not present

## 2015-12-29 DIAGNOSIS — I2510 Atherosclerotic heart disease of native coronary artery without angina pectoris: Secondary | ICD-10-CM | POA: Diagnosis not present

## 2015-12-29 DIAGNOSIS — Z955 Presence of coronary angioplasty implant and graft: Secondary | ICD-10-CM | POA: Insufficient documentation

## 2015-12-29 DIAGNOSIS — G309 Alzheimer's disease, unspecified: Secondary | ICD-10-CM | POA: Diagnosis not present

## 2015-12-29 DIAGNOSIS — E1149 Type 2 diabetes mellitus with other diabetic neurological complication: Secondary | ICD-10-CM | POA: Diagnosis not present

## 2015-12-29 DIAGNOSIS — Z87891 Personal history of nicotine dependence: Secondary | ICD-10-CM | POA: Diagnosis not present

## 2015-12-29 DIAGNOSIS — Z8673 Personal history of transient ischemic attack (TIA), and cerebral infarction without residual deficits: Secondary | ICD-10-CM | POA: Insufficient documentation

## 2015-12-29 DIAGNOSIS — Z7982 Long term (current) use of aspirin: Secondary | ICD-10-CM | POA: Diagnosis not present

## 2015-12-29 DIAGNOSIS — F0280 Dementia in other diseases classified elsewhere without behavioral disturbance: Secondary | ICD-10-CM | POA: Diagnosis not present

## 2015-12-29 DIAGNOSIS — I771 Stricture of artery: Secondary | ICD-10-CM

## 2015-12-29 DIAGNOSIS — G609 Hereditary and idiopathic neuropathy, unspecified: Secondary | ICD-10-CM | POA: Insufficient documentation

## 2015-12-29 DIAGNOSIS — Z8249 Family history of ischemic heart disease and other diseases of the circulatory system: Secondary | ICD-10-CM | POA: Insufficient documentation

## 2015-12-29 DIAGNOSIS — Z6830 Body mass index (BMI) 30.0-30.9, adult: Secondary | ICD-10-CM | POA: Diagnosis not present

## 2015-12-29 LAB — BASIC METABOLIC PANEL
Anion gap: 8 (ref 5–15)
BUN: 9 mg/dL (ref 6–20)
CO2: 23 mmol/L (ref 22–32)
CREATININE: 0.99 mg/dL (ref 0.61–1.24)
Calcium: 9 mg/dL (ref 8.9–10.3)
Chloride: 101 mmol/L (ref 101–111)
GFR calc Af Amer: >60 mL/min (ref >60)
GLUCOSE: 160 mg/dL — AB (ref 65–99)
POTASSIUM: 4.3 mmol/L (ref 3.5–5.1)
SODIUM: 132 mmol/L — AB (ref 135–145)

## 2015-12-29 LAB — CBC WITH DIFFERENTIAL/PLATELET
BASOS ABS: 0 10*3/uL (ref 0.0–0.1)
Basophils Relative: 1 %
EOS ABS: 0.1 10*3/uL (ref 0.0–0.7)
EOS PCT: 2 %
HCT: 36.4 % — ABNORMAL LOW (ref 39.0–52.0)
Hemoglobin: 12.3 g/dL — ABNORMAL LOW (ref 13.0–17.0)
LYMPHS ABS: 1.1 10*3/uL (ref 0.7–4.0)
LYMPHS PCT: 20 %
MCH: 29.6 pg (ref 26.0–34.0)
MCHC: 33.8 g/dL (ref 30.0–36.0)
MCV: 87.7 fL (ref 78.0–100.0)
MONO ABS: 0.5 10*3/uL (ref 0.1–1.0)
Monocytes Relative: 9 %
Neutro Abs: 3.7 10*3/uL (ref 1.7–7.7)
Neutrophils Relative %: 68 %
PLATELETS: 251 10*3/uL (ref 150–400)
RBC: 4.15 MIL/uL — AB (ref 4.22–5.81)
RDW: 13.1 % (ref 11.5–15.5)
WBC: 5.5 10*3/uL (ref 4.0–10.5)

## 2015-12-29 LAB — PROTIME-INR
INR: 1.11 (ref 0.00–1.49)
PROTHROMBIN TIME: 14.5 s (ref 11.6–15.2)

## 2015-12-29 LAB — APTT: APTT: 31 s (ref 24–37)

## 2015-12-29 LAB — GLUCOSE, CAPILLARY: Glucose-Capillary: 151 mg/dL — ABNORMAL HIGH (ref 65–99)

## 2015-12-29 MED ORDER — FENTANYL CITRATE (PF) 100 MCG/2ML IJ SOLN
INTRAMUSCULAR | Status: AC | PRN
  Administered 2015-12-29: 25 ug via INTRAVENOUS

## 2015-12-29 MED ORDER — SODIUM CHLORIDE 0.9 % IV SOLN
INTRAVENOUS | Status: AC | PRN
  Administered 2015-12-29: 75 mL/h via INTRAVENOUS

## 2015-12-29 MED ORDER — LIDOCAINE HCL 1 % IJ SOLN
INTRAMUSCULAR | Status: AC
  Administered 2015-12-29: 10 mL
  Filled 2015-12-29: qty 20

## 2015-12-29 MED ORDER — IOPAMIDOL (ISOVUE-300) INJECTION 61%
INTRAVENOUS | Status: AC
  Administered 2015-12-29: 50 mL
  Filled 2015-12-29: qty 150

## 2015-12-29 MED ORDER — MIDAZOLAM HCL 2 MG/2ML IJ SOLN
INTRAMUSCULAR | Status: AC | PRN
  Administered 2015-12-29: 1 mg via INTRAVENOUS

## 2015-12-29 MED ORDER — FENTANYL CITRATE (PF) 100 MCG/2ML IJ SOLN
INTRAMUSCULAR | Status: AC
  Filled 2015-12-29: qty 2

## 2015-12-29 MED ORDER — HEPARIN SODIUM (PORCINE) 1000 UNIT/ML IJ SOLN
INTRAMUSCULAR | Status: AC
  Filled 2015-12-29: qty 1

## 2015-12-29 MED ORDER — MIDAZOLAM HCL 2 MG/2ML IJ SOLN
INTRAMUSCULAR | Status: AC
  Filled 2015-12-29: qty 2

## 2015-12-29 MED ORDER — SODIUM CHLORIDE 0.9 % IV SOLN: INTRAVENOUS | Status: AC

## 2015-12-29 MED ORDER — SODIUM CHLORIDE 0.9 % IV SOLN: INTRAVENOUS | Status: DC

## 2015-12-29 NOTE — Procedures (Signed)
S/P bilateral CCA arteriograms. RT CFA approach. Findings. 1.Stable 65 % stenosis of RT ICA petrous /cavernous junction,and approx 50 % stenosis of RT ICA cav/supraclinoid . 2 Approx 70 stenosis of RT MCA prox. 3.Appro 50 % stenosis of LT ICA supraclinoid seg

## 2015-12-29 NOTE — Sedation Documentation (Signed)
ETCO2 removed per Dr. Deveshwar 

## 2015-12-29 NOTE — Discharge Instructions (Signed)
Angiogram, Care After °Refer to this sheet in the next few weeks. These instructions provide you with information about caring for yourself after your procedure. Your health care provider may also give you more specific instructions. Your treatment has been planned according to current medical practices, but problems sometimes occur. Call your health care provider if you have any problems or questions after your procedure. °WHAT TO EXPECT AFTER THE PROCEDURE °After your procedure, it is typical to have the following: °· Bruising at the catheter insertion site that usually fades within 1-2 weeks. °· Blood collecting in the tissue (hematoma) that may be painful to the touch. It should usually decrease in size and tenderness within 1-2 weeks. °HOME CARE INSTRUCTIONS °· Take medicines only as directed by your health care provider. °· You may shower 24-48 hours after the procedure or as directed by your health care provider. Remove the bandage (dressing) and gently wash the site with plain soap and water. Pat the area dry with a clean towel. Do not rub the site, because this may cause bleeding. °· Do not take baths, swim, or use a hot tub until your health care provider approves. °· Check your insertion site every day for redness, swelling, or drainage. °· Do not apply powder or lotion to the site. °· Do not lift over 10 lb (4.5 kg) for 5 days after your procedure or as directed by your health care provider. °· Ask your health care provider when it is okay to: °¨ Return to work or school. °¨ Resume usual physical activities or sports. °¨ Resume sexual activity. °· Do not drive home if you are discharged the same day as the procedure. Have someone else drive you. °· You may drive 24 hours after the procedure unless otherwise instructed by your health care provider. °· Do not operate machinery or power tools for 24 hours after the procedure or as directed by your health care provider. °· If your procedure was done as an  outpatient procedure, which means that you went home the same day as your procedure, a responsible adult should be with you for the first 24 hours after you arrive home. °· Keep all follow-up visits as directed by your health care provider. This is important. °SEEK MEDICAL CARE IF: °· You have a fever. °· You have chills. °· You have increased bleeding from the catheter insertion site. Hold pressure on the site.  CALL 911 °SEEK IMMEDIATE MEDICAL CARE IF: °· You have unusual pain at the catheter insertion site. °· You have redness, warmth, or swelling at the catheter insertion site. °· You have drainage (other than a small amount of blood on the dressing) from the catheter insertion site. °· The catheter insertion site is bleeding, and the bleeding does not stop after 30 minutes of holding steady pressure on the site. °· The area near or just beyond the catheter insertion site becomes pale, cool, tingly, or numb. °  °This information is not intended to replace advice given to you by your health care provider. Make sure you discuss any questions you have with your health care provider. °  °Document Released: 01/18/2005 Document Revised: 07/23/2014 Document Reviewed: 12/03/2012 °Elsevier Interactive Patient Education ©2016 [employer] ° °

## 2015-12-29 NOTE — Sedation Documentation (Signed)
Oozing noted to right groin. V-Pad applied

## 2015-12-29 NOTE — Progress Notes (Signed)
Cheryl rad tech held pressure to RFA 20 minutes level 0, no further complications, 10 lb sand bag was applied to right groin.

## 2015-12-29 NOTE — Progress Notes (Signed)
Right groin minimal oozing, rad tech holding pressure, no hematoma, R dp+1, 0/10 pain, Dr Corliss Skainseveshwar was updated.

## 2015-12-29 NOTE — H&P (Signed)
Chief Complaint: Patient was seen in consultation today for cerebral arteriogram at the request of Dr Stephanie Acre  Referring Physician(s):  Dr Stephanie Acre  Supervising Physician: Julieanne Cotton  Patient Status: Outpatient  History of Present Illness: Gregory Ortega is a 70 y.o. male   Pt suffered TIA Dec 13 2015 Noted Rt hand numbness and weakness  Lasting 10-15 minutes Noted confusion over rest of weekend per wife Hx Alzheimer's disease  Work up at hospital: 5/30 CTA IMPRESSION: 1. Stable head and neck arterial findings since 05/17/2015, notable for atherosclerotic related hemodynamically significant stenosis at the: - Right ICA siphon (distal petrous and supraclinoid segments), - Left ICA siphon (supraclinoid segment), - Right MCA posterior M2 branch origin, - non dominant Right Vertebral artery origin (which appears chronically occluded Intracranially), - Right PCA P2 segment and fetal type Left Pcomm PCA origin, - distal Right PCA branches. 2. No acute intracranial abnormality identified. Stable CT appearance of the brain. 3. Stable CT appearance of the neck.  6/2 MR Brain: IMPRESSION: 1. No acute intracranial infarct or other process identified. 2. Generalized age-related cerebral atrophy with mild to moderate chronic small vessel ischemic disease, mildly progressed relative to 2014.  Was referred to Dr Corliss Skains for evaluation and possible treatment Now on Brilinta BID Still with Rt hand weakness Dizzy spell yesterday lasting 15 minutes per wife None now   Past Medical History  Diagnosis Date  . Type II or unspecified type diabetes mellitus without mention of complication, not stated as uncontrolled   . Coronary atherosclerosis of unspecified type of vessel, native or graft   . Type II or unspecified type diabetes mellitus with neurological manifestations, not stated as uncontrolled   . Esophageal reflux   . Obesity, unspecified   .  Unspecified hereditary and idiopathic peripheral neuropathy   . Disturbance of skin sensation   . Other B-complex deficiencies   . Memory difficulty 04/22/2013  . Alzheimer's disease 11/11/2014    Past Surgical History  Procedure Laterality Date  . By-pass surgery  2005  . Moles removal  2013  . Coronary stent placement  2017    Allergies: Statins; Ambien; Metformin and related; Rosuvastatin calcium; and Zetia  Medications: Prior to Admission medications   Medication Sig Start Date End Date Taking? Authorizing Provider  aspirin EC 81 MG tablet Take 81 mg by mouth every morning.   Yes Historical Provider, MD  atorvastatin (LIPITOR) 10 MG tablet Take 5 mg by mouth at bedtime.   Yes Historical Provider, MD  BRILINTA 90 MG TABS tablet Take 90 mg by mouth 2 (two) times daily. 12/08/15  Yes Historical Provider, MD  carvedilol (COREG) 6.25 MG tablet Take 6.25 mg by mouth 2 (two) times daily with a meal.    Yes Historical Provider, MD  co-enzyme Q-10 50 MG capsule Take 200 mg by mouth every morning.    Yes Historical Provider, MD  Cyanocobalamin 2500 MCG TABS Take 1,000 mcg by mouth daily.    Yes Historical Provider, MD  fexofenadine (ALLEGRA) 180 MG tablet Take 180 mg by mouth daily as needed for allergies.    Yes Historical Provider, MD  NAMENDA XR 28 MG CP24 24 hr capsule TAKE ONE CAPSULE BY MOUTH DAILY 07/14/15  Yes York Spaniel, MD  Omega-3 Fatty Acids (SALMON OIL-1000) 200 MG CAPS Take by mouth.   Yes Historical Provider, MD  omeprazole (PRILOSEC) 20 MG capsule Take 20 mg by mouth every morning.    Yes Historical Provider, MD  ranolazine (RANEXA) 1000 MG SR tablet Take 1,000 mg by mouth 2 (two) times daily.   Yes Historical Provider, MD  vitamin E 200 UNIT capsule Take 400 Units by mouth every morning.    Yes Historical Provider, MD  docusate sodium (COLACE) 100 MG capsule Take 100 mg by mouth 2 (two) times daily.    Historical Provider, MD  nitroGLYCERIN (NITROSTAT) 0.4 MG SL tablet  Place 0.4 mg under the tongue every 5 (five) minutes as needed for chest pain.    Historical Provider, MD     Family History  Problem Relation Age of Onset  . Hypertension Mother   . Alzheimer's disease Mother   . Dementia Mother   . Cancer Brother   . Heart attack Brother   . Kidney disease Father   . Alzheimer's disease Father   . Rheum arthritis Sister     Social History   Social History  . Marital Status: Married    Spouse Name: N/A  . Number of Children: 2  . Years of Education: bachelor's   Occupational History  . Retired    Social History Main Topics  . Smoking status: Former Games developer  . Smokeless tobacco: None  . Alcohol Use: No  . Drug Use: No  . Sexual Activity: Not Asked   Other Topics Concern  . None   Social History Narrative   Lives at home w/ his wife and cats   Patient is left handed.   Patient rarely drinks caffeine.     Review of Systems: A 12 point ROS discussed and pertinent positives are indicated in the HPI above.  All other systems are negative.  Review of Systems  Constitutional: Positive for activity change. Negative for fever and fatigue.  HENT: Negative for hearing loss, tinnitus and trouble swallowing.   Eyes: Negative for visual disturbance.  Respiratory: Negative for cough and shortness of breath.   Neurological: Positive for dizziness, weakness and numbness. Negative for tremors, seizures, syncope, facial asymmetry, speech difficulty, light-headedness and headaches.  Psychiatric/Behavioral: Positive for confusion. Negative for behavioral problems.    Vital Signs: BP 165/69 mmHg  Pulse 52  Temp(Src) 97.7 F (36.5 C) (Oral)  Resp 18  Ht 5\' 6"  (1.676 m)  Wt 190 lb (86.183 kg)  BMI 30.68 kg/m2  SpO2 98%  Physical Exam  Constitutional: He is oriented to person, place, and time. He appears well-nourished.  Cardiovascular: Normal rate, regular rhythm and normal heart sounds.   Pulmonary/Chest: Effort normal and breath sounds  normal. He has no wheezes.  Abdominal: Soft. Bowel sounds are normal. There is no tenderness.  Musculoskeletal: Normal range of motion.  Rt hand sl weaker  Neurological: He is alert and oriented to person, place, and time.  Skin: Skin is warm and dry.  Psychiatric: He has a normal mood and affect. His behavior is normal.  Slight confusion noted in conversation Consented with wife at bedside  Nursing note and vitals reviewed.   Mallampati Score:  MD Evaluation Airway: WNL Heart: WNL Abdomen: WNL Chest/ Lungs: WNL ASA  Classification: 3 Mallampati/Airway Score: One  Imaging: Ct Angio Head W/cm &/or Wo Cm  12/13/2015  CLINICAL DATA:  70 year old male with multifocal right ICA and left supraclinoid ICA atherosclerosis and stenosis. Intermittent left side numbness. Subsequent encounter. EXAM: CT ANGIOGRAPHY HEAD AND NECK TECHNIQUE: Multidetector CT imaging of the head and neck was performed using the standard protocol during bolus administration of intravenous contrast. Multiplanar CT image reconstructions and MIPs were obtained to evaluate  the vascular anatomy. Carotid stenosis measurements (when applicable) are obtained utilizing NASCET criteria, using the distal internal carotid diameter as the denominator. CONTRAST:  100 mL Omnipaque 350 COMPARISON:  Cerebral angiogram 05/17/2015. Neck CTA 03/17/2015. Head CT without contrast 03/26/2015 FINDINGS: CT HEAD Brain: Stable cerebral volume. No midline shift, ventriculomegaly, mass effect, evidence of mass lesion, intracranial hemorrhage or evidence of cortically based acute infarction. Stable gray-white matter differentiation throughout the brain. Calvarium and skull base:  No acute osseous abnormality identified. Paranasal sinuses: Visualized paranasal sinuses and mastoids are clear. Orbits: Negative orbit and scalp soft tissues. CTA NECK Skeleton: Prior median sternotomy. No acute osseous abnormality identified. Other neck: Sequelae of CABG. No  superior mediastinal lymphadenopathy. Stable, negative lung apices. Thyroid, larynx, pharynx, parapharyngeal spaces, retropharyngeal space, sublingual space, submandibular glands, and parotid glands are stable and negative. No cervical lymphadenopathy. Aortic arch: 4 vessel arch configuration with aberrant origin of the right subclavian artery re- demonstrated. Stable mild mostly soft arch atherosclerotic plaque with no great vessel origin stenosis. Right carotid system: Soft and calcified plaque at the right carotid bifurcation involving the right ICA origin and bulb with less than 50 % stenosis with respect to the distal vessel at the right ICA origin. Left carotid system: Stable soft and calcified plaque at the left ICA origin and bulb with less than 50 % stenosis with respect to the distal vessel. Vertebral arteries:Dominant left vertebral artery re- demonstrated. Left vertebral artery origin appears normal. Intermittent left vertebral artery tortuosity in the neck with no stenosis. Non dominant right vertebral artery origin appears heavily calcified as before with appearance of high-grade origin stenosis stable by CTA. The right vertebral artery is diminutive but appears stable and remains patent to the skullbase. CTA HEAD Posterior circulation: Severe irregularity and stenosis of the right vertebral artery V4 segment which by angiogram appeared occluded, such that the right vertebral artery functionally terminated outside the skull. Dominant distal left vertebral artery is stable with mild to moderate irregularity at the level of the left PICA origin which remains patent. Subsequent mild left V4 segment stenosis. The left vertebral artery supplies the basilar. Multifocal basilar artery irregularity without stenosis. Patent SCA origins. Right PCA origin within normal limits. Fetal type left PCA origin. Both the left posterior communicating artery and right PCA P2 segment demonstrate moderate to severe  irregularity and stenosis. Superimposed moderate irregularity and stenosis also in the distal right PCA branches (series 16, image 15), whereas the distal left PCA branches appear within normal limits. Right posterior communicating artery is diminutive or absent. Anterior circulation: Both ICA siphons remain patent and are heavily calcified. Moderate to severe bilateral supraclinoid ICA stenosis appears stable from the prior studies and appears most severe in the right supraclinoid segment related to calcified plaque as seen on series 13, image 79 today. Similar moderate to severe right ICA distal petrous segment stenosis is stable and best seen on series 11, image 106. Both carotid termini remain patent. Left MCA and left ACA origins are within normal limits. There is mild irregularity and stenosis at the right ACA and right MCA origins. Anterior communicating artery and proximal ACA A2 segments are within normal limits. There is mild to moderate irregularity and stenosis in the right ACA at the level of the right callosomarginal artery best best seen on series 16, image 18. Left MCA M1 segment and left MCA bifurcation are patent and within normal limits. There is high-grade stenosis at the origin and proximal 5 mm segment of the dominant  posterior right MCA M2 branch best seen on series 13, image 16. Beyond its origin the right MCA M1 segment and right MCA bifurcation are within normal limits. There is mild right MCA branch irregularity. Venous sinuses: Patent. Anatomic variants: Aberrant origin of the right subclavian artery. Dominant left vertebral artery. Fetal type left PCA origin. Delayed phase: No abnormal enhancement identified. IMPRESSION: 1. Stable head and neck arterial findings since 05/17/2015, notable for atherosclerotic related hemodynamically significant stenosis at the: - Right ICA siphon (distal petrous and supraclinoid segments), - Left ICA siphon (supraclinoid segment), - Right MCA posterior M2  branch origin, - non dominant Right Vertebral artery origin (which appears chronically occluded Intracranially), - Right PCA P2 segment and fetal type Left Pcomm PCA origin, - distal Right PCA branches. 2. No acute intracranial abnormality identified. Stable CT appearance of the brain. 3. Stable CT appearance of the neck. Electronically Signed   By: Odessa Fleming M.D.   On: 12/13/2015 15:58   Dg Chest 2 View  12/16/2015  CLINICAL DATA:  TIA EXAM: CHEST  2 VIEW COMPARISON:  12/15/2015 FINDINGS: The heart size and vascular pattern normal. Status post CABG. Lungs are clear. No pleural effusion. IMPRESSION: No active cardiopulmonary disease. Electronically Signed   By: Esperanza Heir M.D.   On: 12/16/2015 07:05   Ct Angio Neck W/cm &/or Wo/cm  12/13/2015  CLINICAL DATA:  70 year old male with multifocal right ICA and left supraclinoid ICA atherosclerosis and stenosis. Intermittent left side numbness. Subsequent encounter. EXAM: CT ANGIOGRAPHY HEAD AND NECK TECHNIQUE: Multidetector CT imaging of the head and neck was performed using the standard protocol during bolus administration of intravenous contrast. Multiplanar CT image reconstructions and MIPs were obtained to evaluate the vascular anatomy. Carotid stenosis measurements (when applicable) are obtained utilizing NASCET criteria, using the distal internal carotid diameter as the denominator. CONTRAST:  100 mL Omnipaque 350 COMPARISON:  Cerebral angiogram 05/17/2015. Neck CTA 03/17/2015. Head CT without contrast 03/26/2015 FINDINGS: CT HEAD Brain: Stable cerebral volume. No midline shift, ventriculomegaly, mass effect, evidence of mass lesion, intracranial hemorrhage or evidence of cortically based acute infarction. Stable gray-white matter differentiation throughout the brain. Calvarium and skull base:  No acute osseous abnormality identified. Paranasal sinuses: Visualized paranasal sinuses and mastoids are clear. Orbits: Negative orbit and scalp soft tissues. CTA  NECK Skeleton: Prior median sternotomy. No acute osseous abnormality identified. Other neck: Sequelae of CABG. No superior mediastinal lymphadenopathy. Stable, negative lung apices. Thyroid, larynx, pharynx, parapharyngeal spaces, retropharyngeal space, sublingual space, submandibular glands, and parotid glands are stable and negative. No cervical lymphadenopathy. Aortic arch: 4 vessel arch configuration with aberrant origin of the right subclavian artery re- demonstrated. Stable mild mostly soft arch atherosclerotic plaque with no great vessel origin stenosis. Right carotid system: Soft and calcified plaque at the right carotid bifurcation involving the right ICA origin and bulb with less than 50 % stenosis with respect to the distal vessel at the right ICA origin. Left carotid system: Stable soft and calcified plaque at the left ICA origin and bulb with less than 50 % stenosis with respect to the distal vessel. Vertebral arteries:Dominant left vertebral artery re- demonstrated. Left vertebral artery origin appears normal. Intermittent left vertebral artery tortuosity in the neck with no stenosis. Non dominant right vertebral artery origin appears heavily calcified as before with appearance of high-grade origin stenosis stable by CTA. The right vertebral artery is diminutive but appears stable and remains patent to the skullbase. CTA HEAD Posterior circulation: Severe irregularity and stenosis of the  right vertebral artery V4 segment which by angiogram appeared occluded, such that the right vertebral artery functionally terminated outside the skull. Dominant distal left vertebral artery is stable with mild to moderate irregularity at the level of the left PICA origin which remains patent. Subsequent mild left V4 segment stenosis. The left vertebral artery supplies the basilar. Multifocal basilar artery irregularity without stenosis. Patent SCA origins. Right PCA origin within normal limits. Fetal type left PCA  origin. Both the left posterior communicating artery and right PCA P2 segment demonstrate moderate to severe irregularity and stenosis. Superimposed moderate irregularity and stenosis also in the distal right PCA branches (series 16, image 15), whereas the distal left PCA branches appear within normal limits. Right posterior communicating artery is diminutive or absent. Anterior circulation: Both ICA siphons remain patent and are heavily calcified. Moderate to severe bilateral supraclinoid ICA stenosis appears stable from the prior studies and appears most severe in the right supraclinoid segment related to calcified plaque as seen on series 13, image 79 today. Similar moderate to severe right ICA distal petrous segment stenosis is stable and best seen on series 11, image 106. Both carotid termini remain patent. Left MCA and left ACA origins are within normal limits. There is mild irregularity and stenosis at the right ACA and right MCA origins. Anterior communicating artery and proximal ACA A2 segments are within normal limits. There is mild to moderate irregularity and stenosis in the right ACA at the level of the right callosomarginal artery best best seen on series 16, image 18. Left MCA M1 segment and left MCA bifurcation are patent and within normal limits. There is high-grade stenosis at the origin and proximal 5 mm segment of the dominant posterior right MCA M2 branch best seen on series 13, image 16. Beyond its origin the right MCA M1 segment and right MCA bifurcation are within normal limits. There is mild right MCA branch irregularity. Venous sinuses: Patent. Anatomic variants: Aberrant origin of the right subclavian artery. Dominant left vertebral artery. Fetal type left PCA origin. Delayed phase: No abnormal enhancement identified. IMPRESSION: 1. Stable head and neck arterial findings since 05/17/2015, notable for atherosclerotic related hemodynamically significant stenosis at the: - Right ICA siphon  (distal petrous and supraclinoid segments), - Left ICA siphon (supraclinoid segment), - Right MCA posterior M2 branch origin, - non dominant Right Vertebral artery origin (which appears chronically occluded Intracranially), - Right PCA P2 segment and fetal type Left Pcomm PCA origin, - distal Right PCA branches. 2. No acute intracranial abnormality identified. Stable CT appearance of the brain. 3. Stable CT appearance of the neck. Electronically Signed   By: Odessa FlemingH  Hall M.D.   On: 12/13/2015 15:58   Mr Brain Wo Contrast  12/16/2015  CLINICAL DATA:  Initial evaluation for acute right arm weakness and numbness. EXAM: MRI HEAD WITHOUT CONTRAST TECHNIQUE: Multiplanar, multiecho pulse sequences of the brain and surrounding structures were obtained without intravenous contrast. COMPARISON:  Prior CT from 12/15/2015. Comparison also made with previous brain MRI from 05/08/2013. FINDINGS: Mild diffuse prominence of the CSF containing spaces is compatible with generalized cerebral atrophy. Patchy and confluent T2/FLAIR hyperintensity within the periventricular, deep, and subcortical white matter of both cerebral hemispheres present, nonspecific, but most likely related to chronic small vessel ischemic disease. This is mild to moderate in nature for patient age. Mild chronic small vessel ischemic type changes present within the pons. No abnormal foci of restricted diffusion to suggest acute intracranial infarct. Gray-white matter differentiation maintained. Major intracranial vascular flow  voids are preserved. No acute intracranial hemorrhage. Few tiny punctate foci susceptibility artifact present within the right parietal lobe, likely small chronic micro hemorrhages, most commonly related to chronic underlying hypertension. No mass lesion, midline shift or mass effect. Mild ventricular prominence related to global parenchymal volume loss without hydrocephalus. No extra-axial fluid collection. Major dural sinuses are grossly  patent. Craniocervical junction normal. Mild degenerative spondylolysis within the visualized upper cervical spine without stenosis. Incidental note made of a partially empty sella. No acute abnormality about the orbits. Mild mucosal thickening within the paranasal sinuses. No air-fluid levels to suggest active sinus infection. Trace opacity within the left mastoid air cells. Right mastoid air cells clear. Inner ear structures normal. Bone marrow signal intensity within normal limits. No scalp soft tissue abnormality. IMPRESSION: 1. No acute intracranial infarct or other process identified. 2. Generalized age-related cerebral atrophy with mild to moderate chronic small vessel ischemic disease, mildly progressed relative to 2014. Electronically Signed   By: Rise Mu M.D.   On: 12/16/2015 05:52   Ir Radiologist Eval & Mgmt  12/27/2015  EXAM: ESTABLISHED PATIENT OFFICE VISIT CHIEF COMPLAINT: Transient ischemic attacks. Current Pain Level: 1-10 HISTORY OF PRESENT ILLNESS: The patient is a 70 year old right-handed gentleman who has been referred for re-evaluation subsequent to episodes of TIAs. The patient is accompanied by his wife. The patient was admitted into the hospital with symptoms of right arm weakness and right hand numbness in the early part of June of this year. The patient's symptoms apparently appeared on waking up that morning. There were no other neurological symptoms. The wife apparently also reported witnessing an episode of confusion and difficulty comprehending commands over that weekend. The patient was brought to the ER. He underwent workup which involved a CT angiogram of the head and neck. This apparently revealed high-grade stenosis of the supraclinoid ICAs bilaterally. The patient was seen by Neurology. The patient's symptoms apparently were notable despite taking aspirin and Brilinta. According to his wife, his diabetes has been recently controlled. Medications: Aspirin 81 mg a  day. Lipitor. Brilinta. Coreg. Coenzyme Q 10. Cyancobalamin. Colace. Allegra. Nemenda XR. Nitrostat as needed. Omega 3 fatty acids. Prilosec. Renexa. Vitamin E capsules. Allergies: Statins cause confusion. Ambien causes irritability. Metformin causes blood sugar to drop too low. However, rosuvastatin causes confusion, memory loss and cannot concentrated. Zetia cause blood sugars to drop too low. REVIEW OF SYSTEMS: Essentially negative for pathologic symptomatology except for as mentioned above. PHYSICAL EXAMINATION: On Plavix and CTA. ASSESSMENT AND PLAN: The patient's previous arteriogram and CT angiogram performed recently were reviewed. Given the fact that the patient is symptomatic despite taking aspirin Plavix, it was felt further evaluation with a formal catheter angiogram was needed. This was discussed with the patient and patient's wife and they were in agreement to proceed with a diagnostic catheter angiogram. Subsequent management depending on the findings on the diagnostic catheter angiogram. Questions were answered to their satisfaction. The patient is scheduled to see his primary care in regards to tighter diabetes control. Electronically Signed   By: Julieanne Cotton M.D.   On: 12/23/2015 17:44    Labs:  CBC:  Recent Labs  05/17/15 0910 12/16/15 0640 12/29/15 0650  WBC 7.1 6.9 5.5  HGB 13.0 12.2* 12.3*  HCT 38.4* 36.9* 36.4*  PLT 277 238 251    COAGS:  Recent Labs  05/17/15 0910 12/29/15 0650  INR 1.03 1.11  APTT  --  31    BMP:  Recent Labs  05/17/15 0910 12/13/15  1236 12/15/15 1821 12/16/15 0640 12/29/15 0650  NA 137  --  134* 136 132*  K 4.4  --  3.7 4.1 4.3  CL 102  --  104 104 101  CO2 26  --  23 26 23   GLUCOSE 152*  --  144* 150* 160*  BUN 10  --  14 13 9   CALCIUM 9.2  --  9.0 8.9 9.0  CREATININE 0.99 1.10 0.99 1.05 0.99  GFRNONAA >60  --  >60 >60 >60  GFRAA >60  --  >60 >60 >60    LIVER FUNCTION TESTS:  Recent Labs  12/15/15 1821  BILITOT  1.1  AST 16  ALT 15*  ALKPHOS 55  PROT 6.1*  ALBUMIN 3.5    TUMOR MARKERS: No results for input(s): AFPTM, CEA, CA199, CHROMGRNA in the last 8760 hours.  Assessment and Plan:  TIA May 2017 Known Alzheimer's CTA revealed Bilateral supraclinoid Internal carotid stenosis Scheduled now for cerebral arteriogram Risks and Benefits discussed with the patient including, but not limited to bleeding, infection, vascular injury, contrast induced renal failure, stroke or even death. All of the patient's questions were answered, patient is agreeable to proceed. Consent signed and in chart.  Thank you for this interesting consult.  I greatly enjoyed meeting Wandell Scullion and look forward to participating in their care.  A copy of this report was sent to the requesting provider on this date.  Electronically Signed: Christia [place] A 12/29/2015, 8:06 AM   I spent a total of  30 Minutes   in face to face in clinical consultation, greater than 50% of which was counseling/coordinating care for cerebral arteriogram

## 2015-12-29 NOTE — Sedation Documentation (Signed)
5 Fr. Exoseal to right groin 

## 2016-01-09 ENCOUNTER — Other Ambulatory Visit: Payer: Self-pay | Admitting: Neurology

## 2016-01-12 ENCOUNTER — Ambulatory Visit (INDEPENDENT_AMBULATORY_CARE_PROVIDER_SITE_OTHER): Payer: Self-pay | Admitting: Neurology

## 2016-01-12 ENCOUNTER — Encounter: Payer: Self-pay | Admitting: Neurology

## 2016-01-12 ENCOUNTER — Ambulatory Visit (INDEPENDENT_AMBULATORY_CARE_PROVIDER_SITE_OTHER): Payer: Medicare Other | Admitting: Neurology

## 2016-01-12 DIAGNOSIS — G5603 Carpal tunnel syndrome, bilateral upper limbs: Secondary | ICD-10-CM

## 2016-01-12 DIAGNOSIS — R202 Paresthesia of skin: Secondary | ICD-10-CM

## 2016-01-12 DIAGNOSIS — M5412 Radiculopathy, cervical region: Secondary | ICD-10-CM

## 2016-01-12 DIAGNOSIS — G5601 Carpal tunnel syndrome, right upper limb: Secondary | ICD-10-CM

## 2016-01-12 DIAGNOSIS — R200 Anesthesia of skin: Secondary | ICD-10-CM

## 2016-01-12 DIAGNOSIS — G5602 Carpal tunnel syndrome, left upper limb: Secondary | ICD-10-CM | POA: Diagnosis not present

## 2016-01-12 DIAGNOSIS — R413 Other amnesia: Secondary | ICD-10-CM

## 2016-01-12 HISTORY — DX: Carpal tunnel syndrome, bilateral upper limbs: G56.03

## 2016-01-12 NOTE — Progress Notes (Signed)
[name] comes in today for EMG and nerve conduction study evaluation. This shows evidence of bilateral carpal tunnel syndrome of moderate severity, but the EMG of the right arm suggests an overlying C8 radiculopathy or a lower trunk brachial plexopathy.  The patient will be sent for MRI of the cervical spine. Bilateral wrist splint prescription was given. He will follow-up in or months or so.

## 2016-01-12 NOTE — Progress Notes (Signed)
Please refer to EMG and nerve conduction study procedure note. 

## 2016-01-12 NOTE — Procedures (Signed)
     HISTORY: Gregory Ortega is a 70 year old gentleman with a history of a progressive memory disturbance. The patient has noted onset of some right-sided hand numbness and weakness that began in the beginning of June 2017. He had a stroke workup that was unremarkable. The patient is being evaluated for the the right hand dysfunction.  NERVE CONDUCTION STUDIES:  Nerve conduction studies were performed on both upper extremities. The distal motor latencies for the median nerves were prolonged bilaterally, with low motor amplitudes for these nerves bilaterally. The distal motor latencies and motor amplitude for the ulnar nerves were normal bilaterally. The F wave latencies for the median nerves were prolonged bilaterally, normal for the ulnar nerves bilaterally. The nerve conduction velocities for the median and ulnar nerves were normal bilaterally. The sensory latencies for the median nerves were prolonged bilaterally, normal for the ulnar nerves bilaterally.  EMG STUDIES:  EMG study was performed on the right upper extremity:  The first dorsal interosseous muscle reveals 2 to 5 K units with decreased recruitment. 2+ fibrillations and positive waves were noted. The abductor pollicis brevis muscle reveals 2 to 6 K units with decreased recruitment. 1+ fibrillations and positive waves were noted. The extensor indicis proprius muscle reveals 2 to 5 K units with decreased recruitment. No fibrillations or positive waves were noted. The pronator teres muscle reveals 2 to 3 K units with full recruitment. No fibrillations or positive waves were noted. The biceps muscle reveals 1 to 2 K units with full recruitment. No fibrillations or positive waves were noted. The triceps muscle reveals 2 to 4 K units with full recruitment. No fibrillations or positive waves were noted. The anterior deltoid muscle reveals 2 to 3 K units with full recruitment. No fibrillations or positive waves were noted. The cervical  paraspinal muscles were tested at 2 levels. No abnormalities of insertional activity were seen at either level tested. There was good relaxation.   IMPRESSION:  Nerve conduction studies done on both upper extremities shows evidence of bilateral carpal tunnel syndrome of moderate severity. EMG evaluation of the right upper extremity shows acute and chronic denervation in several primarily C8 nerve root innervated muscles. No abnormalities within the cervical paraspinal muscles were noted, and a C8 radiculopathy cannot be fully confirmed, but this does remain a possibility. A lower trunk brachial plexus lesion should also be contemplated. Clinical correlation is required.  Gregory Ortega. Keith Jensen Cheramie MD 01/12/2016 12:35 PM  [facility] [address] [place], [place] [id number]  Phone [phone] Fax [phone]

## 2016-01-22 ENCOUNTER — Ambulatory Visit
Admission: RE | Admit: 2016-01-22 | Discharge: 2016-01-22 | Disposition: A | Discharge status: Home / Self Care | Payer: Medicare Other | Source: Ambulatory Visit | Attending: Neurology | Admitting: Neurology

## 2016-01-22 ENCOUNTER — Telehealth: Payer: Self-pay | Admitting: Neurology

## 2016-01-22 DIAGNOSIS — M5412 Radiculopathy, cervical region: Secondary | ICD-10-CM

## 2016-01-22 NOTE — Telephone Encounter (Signed)
  I called the patient and I talked with the wife. The patient could possibly have some C7 NR impingement, but this does not correlate to the EMG which shows C8 involvement. ? Diabetic plexopathy, conservative management may be the treatment option.  MRI cervical 01/22/16:  IMPRESSION: This MRI of the cervical spine without contrast shows the following: 1. Small left foraminal disc protrusion at C2-C3 causing minimal left foraminal narrowing but no nerve root impingement. 2. Mild spinal stenosis at C3-C4 causing mild left foraminal narrowing but no nerve root impingement. 3. Moderate spinal stenosis at C4-C5 due to a left paramedian disc herniation that has occurred since the 03/17/2011 MRI. Although there is mild left foraminal narrowing, there does not appear to be any nerve root compression. 4. Moderate spinal stenosis at C5-C6 causing mild to moderate bilateral foraminal narrowing but no definite nerve root impingement.  5. Disc bulging, uncovertebral spurring and facet hypertrophy causing bilateral moderately severe foraminal narrowing, worse on the left, the could lead to C7 nerve root compression on either side. 6. Borderline anterolisthesis of C7 upon T1 of a degenerative nature associated with severe facet hypertrophy and minimal disc bulging. There is mild bilateral foraminal narrowing but no C8 nerve root compression. These changes have slightly progressed when compared to the prior MRI. 7. The spinal cord appears normal.

## 2016-01-26 ENCOUNTER — Ambulatory Visit: Payer: Medicare Other | Admitting: Neurology

## 2016-02-08 ENCOUNTER — Other Ambulatory Visit: Payer: Self-pay | Admitting: Neurology

## 2016-06-13 ENCOUNTER — Ambulatory Visit (INDEPENDENT_AMBULATORY_CARE_PROVIDER_SITE_OTHER): Payer: Medicare Other | Admitting: Neurology

## 2016-06-13 ENCOUNTER — Encounter: Payer: Self-pay | Admitting: Neurology

## 2016-06-13 VITALS — BP 154/79 | HR 60 | Ht 66.0 in | Wt 185.2 lb

## 2016-06-13 DIAGNOSIS — F0280 Dementia in other diseases classified elsewhere without behavioral disturbance: Secondary | ICD-10-CM | POA: Diagnosis not present

## 2016-06-13 DIAGNOSIS — G309 Alzheimer's disease, unspecified: Secondary | ICD-10-CM

## 2016-06-13 DIAGNOSIS — R413 Other amnesia: Secondary | ICD-10-CM | POA: Diagnosis not present

## 2016-06-13 MED ORDER — MEMANTINE HCL ER 28 MG PO CP24: 28.0000 mg | ORAL_CAPSULE | Freq: Every day | ORAL | 3 refills | Status: DC

## 2016-06-13 NOTE — Progress Notes (Signed)
Reason for visit: Alzheimer's disease  Gregory RuddyVirgil Ortega is an 70 y.o. male  History of present illness:  Gregory Ortega is a 70 year old left-handed white male with a history of Alzheimer's disease. The patient continues to progress with the memory, in the past he has not tolerated Aricept, he remains on Namenda. The patient has recently had a urinary tract infection associated with confusion. He is on antibiotics currently. The patient had some right hand weakness when last seen, the possibility of a C8 radiculopathy was entertained, but this was not confirmed by MRI of the cervical spine. Over time, the right hand weakness has improved. The patient does not operate a motor vehicle, he has given up mowing the yard, he will rake leaves at times. The patient has difficulty managing a remote for the TV, he cannot use a telephone. The patient is having difficulty with dressing at times, he may put on his pants backwards, he will only brush the lower teeth, not the upper teeth. The patient is requiring increasing supervision. He returns to this office for an evaluation.  Past Medical History:  Diagnosis Date  . Alzheimer's disease 11/11/2014  . Bilateral carpal tunnel syndrome 01/12/2016  . Coronary atherosclerosis of unspecified type of vessel, native or graft   . Disturbance of skin sensation   . Esophageal reflux   . Memory difficulty 04/22/2013  . Obesity, unspecified   . Other B-complex deficiencies   . Type II or unspecified type diabetes mellitus with neurological manifestations, not stated as uncontrolled(250.60)   . Type II or unspecified type diabetes mellitus without mention of complication, not stated as uncontrolled   . Unspecified hereditary and idiopathic peripheral neuropathy     Past Surgical History:  Procedure Laterality Date  . BY-PASS SURGERY  2005  . CORONARY STENT PLACEMENT  2017  . MOLES REMOVAL  2013    Family History  Problem Relation Age of Onset  . Hypertension  Mother   . Alzheimer's disease Mother   . Dementia Mother   . Cancer Brother   . Heart attack Brother   . Kidney disease Father   . Alzheimer's disease Father   . Rheum arthritis Sister     Social history:  reports that he has quit smoking. He has never used smokeless tobacco. He reports that he does not drink alcohol or use drugs.    Allergies  Allergen Reactions  . Statins Other (See Comments)    Confusion, memory loss and couldn't concentrate   . Ambien [Zolpidem Tartrate] Other (See Comments)    irritability  . Rosuvastatin Calcium Other (See Comments)    Confusion, memory loss and couldn't concentrate   . Zetia [Ezetimibe] Other (See Comments)    Blood sugar too high    Medications:  Prior to Admission medications   Medication Sig Start Date End Date Taking? Authorizing Provider  aspirin EC 81 MG tablet Take 81 mg by mouth every morning.   Yes Historical Provider, MD  atorvastatin (LIPITOR) 10 MG tablet Take 5 mg by mouth at bedtime.   Yes Historical Provider, MD  BRILINTA 90 MG TABS tablet Take 90 mg by mouth 2 (two) times daily. 12/08/15  Yes Historical Provider, MD  carvedilol (COREG) 6.25 MG tablet Take 6.25 mg by mouth 2 (two) times daily with a meal.    Yes Historical Provider, MD  co-enzyme Q-10 50 MG capsule Take 200 mg by mouth every morning.    Yes Historical Provider, MD  Cyanocobalamin 2500 MCG TABS  Take 1,000 mcg by mouth daily.    Yes Historical Provider, MD  docusate sodium (COLACE) 100 MG capsule Take 100 mg by mouth 2 (two) times daily.   Yes Historical Provider, MD  fexofenadine (ALLEGRA) 180 MG tablet Take 180 mg by mouth daily as needed for allergies.    Yes Historical Provider, MD  NAMENDA XR 28 MG CP24 24 hr capsule TAKE ONE CAPSULE BY MOUTH DAILY 02/08/16  Yes York Spanielharles K Sabin Gibeault, MD  nitroGLYCERIN (NITROSTAT) 0.4 MG SL tablet Place 0.4 mg under the tongue every 5 (five) minutes as needed for chest pain.   Yes Historical Provider, MD  Omega-3 Fatty Acids  (SALMON OIL-1000) 200 MG CAPS Take by mouth.   Yes Historical Provider, MD  omeprazole (PRILOSEC) 20 MG capsule Take 20 mg by mouth every morning.    Yes Historical Provider, MD  ranolazine (RANEXA) 1000 MG SR tablet Take 1,000 mg by mouth 2 (two) times daily.   Yes Historical Provider, MD  vitamin E 200 UNIT capsule Take 400 Units by mouth every morning.    Yes Historical Provider, MD  amoxicillin-clavulanate (AUGMENTIN) 875-125 MG tablet  06/05/16   Historical Provider, MD  Meth-Hyo-M Bl-Na Phos-Ph Sal (URIBEL) 118 MG CAPS  06/05/16   Historical Provider, MD    ROS:  Out of a complete 14 system review of symptoms, the patient complains only of the following symptoms, and all other reviewed systems are negative.  Runny nose Shortness of breath Chest pain Cold intolerance Sleep talking Frequency of urination Bruising easily Memory loss  Blood pressure (!) 154/79, pulse 60, height 5\' 6"  (1.676 m), weight 185 lb 4 oz (84 kg).  Physical Exam  General: The patient is alert and cooperative at the time of the examination.  Skin: No significant peripheral edema is noted.   Neurologic Exam  Mental status: The patient is alert and oriented x 2 at the time of the examination (not oriented to date). The Mini-Mental Status Examination done today shows a total score of 15/30..   Cranial nerves: Facial symmetry is present. Speech is normal, no aphasia or dysarthria is noted. Extraocular movements are full. Visual fields are full.  Motor: The patient has good strength in all 4 extremities.  Sensory examination: Soft touch sensation is symmetric on the face, arms, and legs.  Coordination: The patient has good finger-nose-finger, but the patient is not able to perform heel-to-shin on either side secondary to apraxia.  Gait and station: The patient has a normal gait. Tandem gait is normal. Romberg is negative. No drift is seen.  Reflexes: Deep tendon reflexes are  symmetric.   Assessment/Plan:  1. Alzheimer's disease  2. Right hand weakness, resolved  The patient will continue the Namenda, a prescription was called in. The patient is not having issues with agitation, he is sleeping well at night. In the past, he has not tolerated Aricept. He will follow-up in 6 months, sooner if needed. The right hand weakness has improved.  Marlan Palau. Keith Javeion Cannedy MD 06/13/2016 11:28 AM  [facility] [address] [place], [place] [id number]  Phone [phone] Fax [phone]

## 2016-07-05 ENCOUNTER — Telehealth (HOSPITAL_COMMUNITY): Payer: Self-pay

## 2016-07-05 NOTE — Telephone Encounter (Signed)
Returned call, left message for pt's wife to call back. AW

## 2016-07-25 ENCOUNTER — Other Ambulatory Visit: Payer: Self-pay | Admitting: Gastroenterology

## 2016-07-25 ENCOUNTER — Other Ambulatory Visit (HOSPITAL_COMMUNITY): Payer: Self-pay | Admitting: Interventional Radiology

## 2016-07-25 DIAGNOSIS — G459 Transient cerebral ischemic attack, unspecified: Secondary | ICD-10-CM

## 2016-07-25 DIAGNOSIS — Z7901 Long term (current) use of anticoagulants: Secondary | ICD-10-CM

## 2016-07-26 ENCOUNTER — Other Ambulatory Visit: Payer: Self-pay | Admitting: Radiology

## 2016-07-27 ENCOUNTER — Other Ambulatory Visit (HOSPITAL_COMMUNITY): Payer: Self-pay | Admitting: Interventional Radiology

## 2016-07-27 ENCOUNTER — Ambulatory Visit (HOSPITAL_COMMUNITY)
Admission: RE | Admit: 2016-07-27 | Discharge: 2016-07-27 | Disposition: A | Discharge status: Home / Self Care | Payer: Medicare Other | Source: Ambulatory Visit | Attending: Interventional Radiology | Admitting: Interventional Radiology

## 2016-07-27 ENCOUNTER — Encounter (HOSPITAL_COMMUNITY): Payer: Self-pay

## 2016-07-27 DIAGNOSIS — Z7902 Long term (current) use of antithrombotics/antiplatelets: Secondary | ICD-10-CM | POA: Insufficient documentation

## 2016-07-27 DIAGNOSIS — Z6829 Body mass index (BMI) 29.0-29.9, adult: Secondary | ICD-10-CM | POA: Diagnosis not present

## 2016-07-27 DIAGNOSIS — Z8249 Family history of ischemic heart disease and other diseases of the circulatory system: Secondary | ICD-10-CM | POA: Insufficient documentation

## 2016-07-27 DIAGNOSIS — K219 Gastro-esophageal reflux disease without esophagitis: Secondary | ICD-10-CM | POA: Diagnosis not present

## 2016-07-27 DIAGNOSIS — E669 Obesity, unspecified: Secondary | ICD-10-CM | POA: Insufficient documentation

## 2016-07-27 DIAGNOSIS — E1149 Type 2 diabetes mellitus with other diabetic neurological complication: Secondary | ICD-10-CM | POA: Diagnosis not present

## 2016-07-27 DIAGNOSIS — Z87891 Personal history of nicotine dependence: Secondary | ICD-10-CM | POA: Insufficient documentation

## 2016-07-27 DIAGNOSIS — Z86718 Personal history of other venous thrombosis and embolism: Secondary | ICD-10-CM | POA: Insufficient documentation

## 2016-07-27 DIAGNOSIS — Z7982 Long term (current) use of aspirin: Secondary | ICD-10-CM | POA: Insufficient documentation

## 2016-07-27 DIAGNOSIS — G459 Transient cerebral ischemic attack, unspecified: Secondary | ICD-10-CM | POA: Diagnosis present

## 2016-07-27 DIAGNOSIS — I6502 Occlusion and stenosis of left vertebral artery: Secondary | ICD-10-CM | POA: Diagnosis not present

## 2016-07-27 DIAGNOSIS — G309 Alzheimer's disease, unspecified: Secondary | ICD-10-CM | POA: Insufficient documentation

## 2016-07-27 DIAGNOSIS — I6523 Occlusion and stenosis of bilateral carotid arteries: Secondary | ICD-10-CM | POA: Diagnosis not present

## 2016-07-27 DIAGNOSIS — F0280 Dementia in other diseases classified elsewhere without behavioral disturbance: Secondary | ICD-10-CM | POA: Diagnosis not present

## 2016-07-27 DIAGNOSIS — I2510 Atherosclerotic heart disease of native coronary artery without angina pectoris: Secondary | ICD-10-CM | POA: Diagnosis not present

## 2016-07-27 DIAGNOSIS — Z951 Presence of aortocoronary bypass graft: Secondary | ICD-10-CM | POA: Diagnosis not present

## 2016-07-27 HISTORY — PX: IR GENERIC HISTORICAL: IMG1180011

## 2016-07-27 LAB — CBC
HEMATOCRIT: 36.3 % — AB (ref 39.0–52.0)
Hemoglobin: 12.4 g/dL — ABNORMAL LOW (ref 13.0–17.0)
MCH: 29.6 pg (ref 26.0–34.0)
MCHC: 34.2 g/dL (ref 30.0–36.0)
MCV: 86.6 fL (ref 78.0–100.0)
PLATELETS: 303 10*3/uL (ref 150–400)
RBC: 4.19 MIL/uL — ABNORMAL LOW (ref 4.22–5.81)
RDW: 13.8 % (ref 11.5–15.5)
WBC: 7 10*3/uL (ref 4.0–10.5)

## 2016-07-27 LAB — APTT: APTT: 31 s (ref 24–36)

## 2016-07-27 LAB — BASIC METABOLIC PANEL
Anion gap: 12 (ref 5–15)
BUN: 10 mg/dL (ref 6–20)
CO2: 22 mmol/L (ref 22–32)
CREATININE: 1 mg/dL (ref 0.61–1.24)
Calcium: 9.4 mg/dL (ref 8.9–10.3)
Chloride: 100 mmol/L — ABNORMAL LOW (ref 101–111)
GFR calc Af Amer: >60 mL/min (ref >60)
GLUCOSE: 157 mg/dL — AB (ref 65–99)
POTASSIUM: 4.7 mmol/L (ref 3.5–5.1)
SODIUM: 134 mmol/L — AB (ref 135–145)

## 2016-07-27 LAB — PROTIME-INR
INR: 1.03
Prothrombin Time: 13.5 seconds (ref 11.4–15.2)

## 2016-07-27 LAB — GLUCOSE, CAPILLARY: GLUCOSE-CAPILLARY: 111 mg/dL — AB (ref 65–99)

## 2016-07-27 MED ORDER — LIDOCAINE HCL (PF) 1 % IJ SOLN
INTRAMUSCULAR | Status: AC
  Filled 2016-07-27: qty 15

## 2016-07-27 MED ORDER — SODIUM CHLORIDE 0.9 % IV SOLN: INTRAVENOUS | Status: AC

## 2016-07-27 MED ORDER — MIDAZOLAM HCL 2 MG/2ML IJ SOLN
INTRAMUSCULAR | Status: AC
  Filled 2016-07-27: qty 2

## 2016-07-27 MED ORDER — HEPARIN SOD (PORK) LOCK FLUSH 100 UNIT/ML IV SOLN
INTRAVENOUS | Status: AC
  Administered 2016-07-27: 1000 [IU]
  Filled 2016-07-27: qty 5

## 2016-07-27 MED ORDER — IOPAMIDOL (ISOVUE-300) INJECTION 61%
INTRAVENOUS | Status: AC
  Administered 2016-07-27: 75 mL
  Filled 2016-07-27: qty 150

## 2016-07-27 MED ORDER — MIDAZOLAM HCL 2 MG/2ML IJ SOLN
INTRAMUSCULAR | Status: AC | PRN
  Administered 2016-07-27: 0.5 mg via INTRAVENOUS

## 2016-07-27 MED ORDER — LIDOCAINE HCL 1 % IJ SOLN
INTRAMUSCULAR | Status: AC | PRN
  Administered 2016-07-27: 10 mL

## 2016-07-27 MED ORDER — IOPAMIDOL (ISOVUE-300) INJECTION 61%
INTRAVENOUS | Status: AC
  Administered 2016-07-27: 15 mL
  Filled 2016-07-27: qty 50

## 2016-07-27 MED ORDER — FENTANYL CITRATE (PF) 100 MCG/2ML IJ SOLN
INTRAMUSCULAR | Status: AC | PRN
  Administered 2016-07-27: 25 ug via INTRAVENOUS

## 2016-07-27 MED ORDER — FENTANYL CITRATE (PF) 100 MCG/2ML IJ SOLN
INTRAMUSCULAR | Status: AC
  Filled 2016-07-27: qty 2

## 2016-07-27 MED ORDER — SODIUM CHLORIDE 0.9 % IV SOLN
Freq: Once | INTRAVENOUS | Status: AC
  Administered 2016-07-27: 09:00:00 via INTRAVENOUS

## 2016-07-27 NOTE — Sedation Documentation (Signed)
Patient is resting comfortably. 

## 2016-07-27 NOTE — Discharge Instructions (Signed)
Cerebral Angiogram, Care After °Refer to this sheet in the next few weeks. These instructions provide you with information on caring for yourself after your procedure. Your health care provider may also give you more specific instructions. Your treatment has been planned according to current medical practices, but problems sometimes occur. Call your health care provider if you have any problems or questions after your procedure. °What can I expect after the procedure? °After your procedure, it is typical to have the following: °· Bruising at the catheter insertion site that usually fades within 1-2 weeks. °· Blood collecting in the tissue (hematoma) that may be painful to the touch. It should usually decrease in size and tenderness within 1-2 weeks. °· A mild headache. °Follow these instructions at home: °· Take medicines only as directed by your health care provider. °· You may shower 24-48 hours after the procedure or as directed by your health care provider. Remove the bandage (dressing) and gently wash the site with plain soap and water. Pat the area dry with a clean towel. Do not rub the site, because this may cause bleeding. °· Do not take baths, swim, or use a hot tub until your health care provider approves. °· Check your insertion site every day for redness, swelling, or drainage. °· Do not apply powder or lotion to the site. °· Do not lift over 10 lb (4.5 kg) for 5 days after your procedure or as directed by your health care provider. °· Ask your health care provider when it is okay to: °¨ Return to work or school. °¨ Resume usual physical activities or sports. °¨ Resume sexual activity. °· Do not drive home if you are discharged the same day as the procedure. Have someone else drive you. °· You may drive 24 hours after the procedure unless otherwise instructed by your health care provider. °· Do not operate machinery or power tools for 24 hours after the procedure or as directed by your health care  provider. °· If your procedure was done as an outpatient procedure, which means that you went home the same day as your procedure, a responsible adult should be with you for the first 24 hours after you arrive home. °· Keep all follow-up visits as directed by your health care provider. This is important. °Contact a health care provider if: °· You have a fever. °· You have chills. °· You have increased bleeding from the catheter insertion site. Hold pressure on the site. °Get help right away if: °· You have vision changes or loss of vision. °· You have numbness or weakness on one side of your body. °· You have difficulty talking, or you have slurred speech or cannot speak (aphasia). °· You feel confused or have difficulty remembering. °· You have unusual pain at the catheter insertion site. °· You have redness, warmth, or swelling at the catheter insertion site. °· You have drainage (other than a small amount of blood on the dressing) from the catheter insertion site. °· The catheter insertion site is bleeding, and the bleeding does not stop after 30 minutes of holding steady pressure on the site. °These symptoms may represent a serious problem that is an emergency. Do not wait to see if the symptoms will go away. Get medical help right away. Call your local emergency services (911 in [place] Do not drive yourself to the hospital. °This information is not intended to replace advice given to you by your health care provider. Make sure you discuss any questions you   have with your health care provider. °Document Released: 11/16/2013 Document Revised: 12/08/2015 Document Reviewed: 07/15/2013 °Elsevier Interactive Patient Education © 2017 [employer] °Moderate Conscious Sedation, Adult, Care After °These instructions provide you with information about caring for yourself after your procedure. Your health care provider may also give you more specific instructions. Your treatment has been planned according to current  medical practices, but problems sometimes occur. Call your health care provider if you have any problems or questions after your procedure. °What can I expect after the procedure? °After your procedure, it is common: °· To feel sleepy for several hours. °· To feel clumsy and have poor balance for several hours. °· To have poor judgment for several hours. °· To vomit if you eat too soon. °Follow these instructions at home: °For at least 24 hours after the procedure:  ° °· Do not: °¨ Participate in activities where you could fall or become injured. °¨ Drive. °¨ Use heavy machinery. °¨ Drink alcohol. °¨ Take sleeping pills or medicines that cause drowsiness. °¨ Make important decisions or sign legal documents. °¨ Take care of children on your own. °· Rest. °Eating and drinking  °· Follow the diet recommended by your health care provider. °· If you vomit: °¨ Drink water, juice, or soup when you can drink without vomiting. °¨ Make sure you have little or no nausea before eating solid foods. °General instructions  °· Have a responsible adult stay with you until you are awake and alert. °· Take over-the-counter and prescription medicines only as told by your health care provider. °· If you smoke, do not smoke without supervision. °· Keep all follow-up visits as told by your health care provider. This is important. °Contact a health care provider if: °· You keep feeling nauseous or you keep vomiting. °· You feel light-headed. °· You develop a rash. °· You have a fever. °Get help right away if: °· You have trouble breathing. °This information is not intended to replace advice given to you by your health care provider. Make sure you discuss any questions you have with your health care provider. °Document Released: 04/22/2013 Document Revised: 12/05/2015 Document Reviewed: 10/22/2015 °Elsevier Interactive Patient Education © 2017 [employer] ° °

## 2016-07-27 NOTE — Procedures (Signed)
S/P 4 vessel cerebral arteriogram. RT CFa approach. Findings. 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 2.Severe stenosis of RT PCA P1 and P2 segments worse. 3..Approc 50 stenosis of LT ICA supraclinoid seg. 4.Severe stenosis of RT VA at origin

## 2016-07-27 NOTE — Sedation Documentation (Signed)
Right and left pedal pulses doppler only.

## 2016-07-27 NOTE — H&P (Signed)
Chief Complaint: Patient was seen in consultation today for cerebral arteriogram at the request of Stephanie Acre MD  Referring Physician(s): Dr Stephanie Acre  Supervising Physician: Julieanne Cotton  Patient Status: [facility] - Out-pt  History of Present Illness: Gregory Ortega is a 71 y.o. male   Pt with Alzheimers Dz Wife states symptoms of memory loss and function is worsening She now has to bathe him He is extremely forgetful. Angiogram in 12/2015 was performed for TIA occurences.  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 Approximately 70% stenosis of the right middle cerebral artery at its origin, with another area of 50% stenosis of the right middle cerebral artery proximal to the bifurcation branches. Approximately 50% stenosis of the left internal carotid artery in the supraclinoid segment.  Was seen in 05/2016 by Dr Anne Hahn because of progressing memory loss and confusion. Request for cerebral arteriogram to evaluate cerebral artery stenosis. Scheduled now for same.  Past Medical History:  Diagnosis Date  . Alzheimer's disease 11/11/2014  . Bilateral carpal tunnel syndrome 01/12/2016  . Coronary atherosclerosis of unspecified type of vessel, native or graft   . Disturbance of skin sensation   . Esophageal reflux   . Memory difficulty 04/22/2013  . Obesity, unspecified   . Other B-complex deficiencies   . Type II or unspecified type diabetes mellitus with neurological manifestations, not stated as uncontrolled(250.60)   . Type II or unspecified type diabetes mellitus without mention of complication, not stated as uncontrolled   . Unspecified hereditary and idiopathic peripheral neuropathy     Past Surgical History:  Procedure Laterality Date  . BY-PASS SURGERY  2005  . CORONARY  STENT PLACEMENT  2017  . MOLES REMOVAL  2013    Allergies: Statins; Ambien [zolpidem tartrate]; Rosuvastatin calcium; and Zetia [ezetimibe]  Medications: Prior to Admission medications   Medication Sig Start Date End Date Taking? Authorizing Provider  aspirin EC 81 MG tablet Take 81 mg by mouth every morning.   Yes Historical Provider, MD  atorvastatin (LIPITOR) 10 MG tablet Take 5 mg by mouth at bedtime.   Yes Historical Provider, MD  carvedilol (COREG) 6.25 MG tablet Take 3.125 mg by mouth See admin instructions. Pt to take twice daily ONLY IF BLOOD PRESSURE IS OVER 160   Yes Historical Provider, MD  clopidogrel (PLAVIX) 75 MG tablet Take 75 mg by mouth daily.   Yes Historical Provider, MD  co-enzyme Q-10 50 MG capsule Take 200 mg by mouth every morning.    Yes Historical Provider, MD  docusate sodium (COLACE) 100 MG capsule Take 100 mg by mouth 2 (two) times daily.   Yes Historical Provider, MD  memantine (NAMENDA XR) 28 MG CP24 24 hr capsule Take 1 capsule (28 mg total) by mouth daily. 06/13/16  Yes York Spaniel, MD  Omega-3 Fatty Acids (SALMON OIL-1000) 200 MG CAPS Take 1 capsule by mouth 2 (two) times daily.    Yes Historical Provider, MD  omeprazole (PRILOSEC) 20 MG capsule Take 20 mg by mouth every morning.    Yes Historical Provider, MD  ranolazine (RANEXA) 1000 MG SR tablet Take 1,000 mg by mouth 2 (two) times daily.   Yes Historical Provider, MD  vitamin B-12 (CYANOCOBALAMIN) 1000 MCG tablet Take 1,000 mcg by mouth daily.   Yes Historical Provider, MD  vitamin E 400 UNIT capsule Take 400 Units by mouth  daily.   Yes Historical Provider, MD  fexofenadine (ALLEGRA) 180 MG tablet Take 180 mg by mouth daily as needed for allergies.     Historical Provider, MD  nitroGLYCERIN (NITROSTAT) 0.4 MG SL tablet Place 0.4 mg under the tongue every 5 (five) minutes as needed for chest pain.    Historical Provider, MD     Family History  Problem Relation Age of Onset  . Hypertension Mother    . Alzheimer's disease Mother   . Dementia Mother   . Cancer Brother   . Heart attack Brother   . Kidney disease Father   . Alzheimer's disease Father   . Rheum arthritis Sister     Social History   Social History  . Marital status: Married    Spouse name: N/A  . Number of children: 2  . Years of education: bachelor's   Occupational History  . Retired Retired   Social History Main Topics  . Smoking status: Former Games developermoker  . Smokeless tobacco: Never Used  . Alcohol use No  . Drug use: No  . Sexual activity: Not Asked   Other Topics Concern  . None   Social History Narrative   Lives at home w/ his wife and cats   Patient is left handed.   Patient rarely drinks caffeine.    Review of Systems: A 12 point ROS discussed and pertinent positives are indicated in the HPI above.  All other systems are negative.  Review of Systems  Constitutional: Negative for activity change, appetite change, fatigue and fever.  HENT: Negative for tinnitus and trouble swallowing.   Eyes: Negative for visual disturbance.  Respiratory: Negative for shortness of breath.   Cardiovascular: Negative for chest pain.  Gastrointestinal: Negative for abdominal pain.  Neurological: Negative for dizziness, tremors, seizures, syncope, facial asymmetry, speech difficulty, weakness, light-headedness, numbness and headaches.  Psychiatric/Behavioral: Positive for confusion and decreased concentration. Negative for behavioral problems.    Vital Signs: BP (!) 151/68   Pulse 76   Temp 98.6 F (37 C) (Oral)   Resp 16   Ht 5\' 6"  (1.676 m)   Wt 185 lb (83.9 kg)   SpO2 98%   BMI 29.86 kg/m   Physical Exam  Constitutional: He appears well-nourished.  HENT:  Head: Atraumatic.  Eyes: EOM are normal.  Neck: Neck supple.  Cardiovascular: Normal rate, regular rhythm and normal heart sounds.   Pulmonary/Chest: Effort normal and breath sounds normal.  Abdominal: Bowel sounds are normal.  Musculoskeletal:  Normal range of motion.  Neurological: He is alert.  Skin: Skin is warm and dry.  Psychiatric: He has a normal mood and affect.  Consented with wife at bedside  Nursing note and vitals reviewed.   Mallampati Score:  MD Evaluation Airway: WNL Heart: WNL Abdomen: WNL Chest/ Lungs: WNL ASA  Classification: 3 Mallampati/Airway Score: One  Imaging: No results found.  Labs:  CBC:  Recent Labs  12/16/15 0640 12/29/15 0650 07/27/16 0835  WBC 6.9 5.5 7.0  HGB 12.2* 12.3* 12.4*  HCT 36.9* 36.4* 36.3*  PLT 238 251 303    COAGS:  Recent Labs  12/29/15 0650  INR 1.11  APTT 31    BMP:  Recent Labs  12/15/15 1821 12/16/15 0640 12/29/15 0650 07/27/16 0835  NA 134* 136 132* 134*  K 3.7 4.1 4.3 4.7  CL 104 104 101 100*  CO2 23 26 23 22   GLUCOSE 144* 150* 160* 157*  BUN 14 13 9 10   CALCIUM 9.0 8.9 9.0  9.4  CREATININE 0.99 1.05 0.99 1.00  GFRNONAA >60 >60 >60 >60  GFRAA >60 >60 >60 >60    LIVER FUNCTION TESTS:  Recent Labs  12/15/15 1821  BILITOT 1.1  AST 16  ALT 15*  ALKPHOS 55  PROT 6.1*  ALBUMIN 3.5    TUMOR MARKERS: No results for input(s): AFPTM, CEA, CA199, CHROMGRNA in the last 8760 hours.  Assessment and Plan:  Known Alzheimer Disease Worsening memory loss; and forgetfulness Previous cerebral arteriogram reveals R ICA and MCA stenosis; L ICA stenosis Now scheduled for recheck arteriogram Risks and Benefits discussed with the patient including, but not limited to bleeding, infection, vascular injury, contrast induced renal failure, stroke or even death. All of the patient's questions were answered, patient is agreeable to proceed. Consent signed and in chart.  Thank you for this interesting consult.  I greatly enjoyed meeting Thailan Sava and look forward to participating in their care.  A copy of this report was sent to the requesting provider on this date.  Electronically Signed: Raylin Diguglielmo A 07/27/2016, 9:23 AM   I spent a  total of  30 Minutes   in face to face in clinical consultation, greater than 50% of which was counseling/coordinating care for cerebral arteriogram

## 2016-07-28 ENCOUNTER — Encounter (HOSPITAL_COMMUNITY): Payer: Self-pay | Admitting: Interventional Radiology

## 2016-08-09 ENCOUNTER — Encounter (HOSPITAL_COMMUNITY): Payer: Self-pay | Admitting: Interventional Radiology

## 2016-08-15 ENCOUNTER — Ambulatory Visit
Admission: RE | Admit: 2016-08-15 | Discharge: 2016-08-15 | Disposition: A | Discharge status: Home / Self Care | Payer: Medicare Other | Source: Ambulatory Visit | Attending: Gastroenterology | Admitting: Gastroenterology

## 2016-08-15 DIAGNOSIS — Z7901 Long term (current) use of anticoagulants: Secondary | ICD-10-CM

## 2016-08-27 ENCOUNTER — Telehealth: Payer: Self-pay | Admitting: Neurology

## 2016-08-27 NOTE — Telephone Encounter (Signed)
I called patient, talk with the wife. The patient has had some episodes of orthostatic hypotension. 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 The patient is to take Gatorade in the morning and early afternoon to help keep up blood pressures.

## 2016-08-27 NOTE — Telephone Encounter (Signed)
Patients wife called to advised Dr. Anne HahnWillis his PCP with [facility] [place] is going to be faxing over medical records from his last visit and not sure if he needs to be seen sooner than his scheduled appointment in May.

## 2016-08-27 NOTE — Telephone Encounter (Signed)
Dr Willis- please advise 

## 2016-09-13 ENCOUNTER — Encounter (HOSPITAL_COMMUNITY): Payer: Self-pay | Admitting: Interventional Radiology

## 2016-10-03 ENCOUNTER — Telehealth: Payer: Self-pay | Admitting: *Deleted

## 2016-10-03 NOTE — Telephone Encounter (Signed)
I called Dr. Randa EvensEdwards, for my standpoint, the patient may come off of aspirin and Plavix for a few days prior to procedure, the patient however has had a coronary artery stent placed sometime in 2017, this may be the primary reason for the aspirin and Plavix combination, the patient has had a recent cerebral angiogram that shows atherosclerotic changes with some progression over the last year or 2.

## 2016-10-03 NOTE — Telephone Encounter (Signed)
Took call from phone room. Spoke with Dr Carman ChingJames Edwards. He stated patient has colon polyp he would like to remove. He is on ASA and plavix. Wife telling him that he cannot stop those for any reason. Dr Randa EvensEdwards would like to verify this with Dr Anne HahnWillis.   He is wanting to stop both 3-5 days total for surgery if possible. He states Dr Anne HahnWillis can call main number at [phone] and LVM if needed. This is not urgent.

## 2016-11-07 ENCOUNTER — Encounter (HOSPITAL_COMMUNITY): Payer: Self-pay | Admitting: *Deleted

## 2016-11-07 ENCOUNTER — Other Ambulatory Visit: Payer: Self-pay | Admitting: Gastroenterology

## 2016-11-07 NOTE — Progress Notes (Signed)
Pt gave verbal consent for spouse, Dewayne Hatch to complete SDW-pre-op call. Spouse denies that pt C/O any acute cardiopulmonary issues. Spouse stated that pt last dose of Aspirin and Plavix was 11/06/16 per Dr. Tomie China, Cardiology. Spouse stated that pt is no longer being treated for diabetes. Spouse made aware to have pt stop taking vitamins, fish oil, Co Q 10,  and herbal medications. Do not take any NSAIDs ie: Ibuprofen, Advil, Naproxen, BC and Goody powder. Spouse verbalized understanding of all pre-op instructions.

## 2016-11-09 ENCOUNTER — Ambulatory Visit (HOSPITAL_COMMUNITY): Payer: Medicare Other | Admitting: Certified Registered"

## 2016-11-09 ENCOUNTER — Encounter (HOSPITAL_COMMUNITY)
Admission: RE | Disposition: A | Discharge status: Home / Self Care | Payer: Self-pay | Source: Ambulatory Visit | Attending: Gastroenterology

## 2016-11-09 ENCOUNTER — Encounter (HOSPITAL_COMMUNITY): Payer: Self-pay | Admitting: *Deleted

## 2016-11-09 ENCOUNTER — Ambulatory Visit (HOSPITAL_COMMUNITY)
Admission: RE | Admit: 2016-11-09 | Discharge: 2016-11-09 | Disposition: A | Discharge status: Home / Self Care | Payer: Medicare Other | Source: Ambulatory Visit | Attending: Gastroenterology | Admitting: Gastroenterology

## 2016-11-09 DIAGNOSIS — Z7982 Long term (current) use of aspirin: Secondary | ICD-10-CM | POA: Insufficient documentation

## 2016-11-09 DIAGNOSIS — I2510 Atherosclerotic heart disease of native coronary artery without angina pectoris: Secondary | ICD-10-CM | POA: Diagnosis not present

## 2016-11-09 DIAGNOSIS — K219 Gastro-esophageal reflux disease without esophagitis: Secondary | ICD-10-CM | POA: Diagnosis not present

## 2016-11-09 DIAGNOSIS — K640 First degree hemorrhoids: Secondary | ICD-10-CM | POA: Insufficient documentation

## 2016-11-09 DIAGNOSIS — E119 Type 2 diabetes mellitus without complications: Secondary | ICD-10-CM | POA: Diagnosis not present

## 2016-11-09 DIAGNOSIS — G309 Alzheimer's disease, unspecified: Secondary | ICD-10-CM | POA: Insufficient documentation

## 2016-11-09 DIAGNOSIS — E669 Obesity, unspecified: Secondary | ICD-10-CM | POA: Insufficient documentation

## 2016-11-09 DIAGNOSIS — I10 Essential (primary) hypertension: Secondary | ICD-10-CM | POA: Diagnosis not present

## 2016-11-09 DIAGNOSIS — E785 Hyperlipidemia, unspecified: Secondary | ICD-10-CM | POA: Insufficient documentation

## 2016-11-09 DIAGNOSIS — Z6823 Body mass index (BMI) 23.0-23.9, adult: Secondary | ICD-10-CM | POA: Insufficient documentation

## 2016-11-09 DIAGNOSIS — Z79899 Other long term (current) drug therapy: Secondary | ICD-10-CM | POA: Diagnosis not present

## 2016-11-09 DIAGNOSIS — Z86010 Personal history of colonic polyps: Secondary | ICD-10-CM | POA: Diagnosis not present

## 2016-11-09 DIAGNOSIS — Z87891 Personal history of nicotine dependence: Secondary | ICD-10-CM | POA: Diagnosis not present

## 2016-11-09 DIAGNOSIS — Z8673 Personal history of transient ischemic attack (TIA), and cerebral infarction without residual deficits: Secondary | ICD-10-CM | POA: Diagnosis not present

## 2016-11-09 DIAGNOSIS — K5730 Diverticulosis of large intestine without perforation or abscess without bleeding: Secondary | ICD-10-CM | POA: Insufficient documentation

## 2016-11-09 DIAGNOSIS — G5603 Carpal tunnel syndrome, bilateral upper limbs: Secondary | ICD-10-CM | POA: Insufficient documentation

## 2016-11-09 DIAGNOSIS — Z955 Presence of coronary angioplasty implant and graft: Secondary | ICD-10-CM | POA: Diagnosis not present

## 2016-11-09 DIAGNOSIS — R195 Other fecal abnormalities: Secondary | ICD-10-CM | POA: Diagnosis present

## 2016-11-09 HISTORY — PX: COLONOSCOPY: SHX5424

## 2016-11-09 HISTORY — DX: Polyp of colon: K63.5

## 2016-11-09 HISTORY — DX: Essential (primary) hypertension: I10

## 2016-11-09 HISTORY — DX: Anemia, unspecified: D64.9

## 2016-11-09 LAB — GLUCOSE, CAPILLARY: Glucose-Capillary: 98 mg/dL (ref 65–99)

## 2016-11-09 SURGERY — COLONOSCOPY
Anesthesia: Monitor Anesthesia Care

## 2016-11-09 MED ORDER — PROPOFOL 500 MG/50ML IV EMUL
INTRAVENOUS | Status: DC | PRN
  Administered 2016-11-09: 100 ug/kg/min via INTRAVENOUS

## 2016-11-09 MED ORDER — PHENYLEPHRINE HCL 10 MG/ML IJ SOLN
INTRAMUSCULAR | Status: DC | PRN
  Administered 2016-11-09: 40 ug via INTRAVENOUS

## 2016-11-09 MED ORDER — PROPOFOL 10 MG/ML IV BOLUS
INTRAVENOUS | Status: DC | PRN
  Administered 2016-11-09: 40 mg via INTRAVENOUS

## 2016-11-09 MED ORDER — EPHEDRINE SULFATE 50 MG/ML IJ SOLN
INTRAMUSCULAR | Status: DC | PRN
  Administered 2016-11-09: 10 mg via INTRAVENOUS
  Administered 2016-11-09 (×2): 5 mg via INTRAVENOUS
  Administered 2016-11-09: 15 mg via INTRAVENOUS
  Administered 2016-11-09: 5 mg via INTRAVENOUS

## 2016-11-09 MED ORDER — LACTATED RINGERS IV SOLN
INTRAVENOUS | Status: DC
  Administered 2016-11-09 (×2): via INTRAVENOUS

## 2016-11-09 NOTE — Transfer of Care (Signed)
Immediate Anesthesia Transfer of Care Note  Patient: Gregory Ortega  Procedure(s) Performed: Procedure(s): COLONOSCOPY (N/A)  Patient Location: Endoscopy Unit  Anesthesia Type:MAC  Level of Consciousness: awake, alert  and oriented  Airway & Oxygen Therapy: Patient Spontanous Breathing and Patient connected to face mask oxygen  Post-op Assessment: Report given to RN and Post -op Vital signs reviewed and stable  Post vital signs: Reviewed and stable  Last Vitals:  Vitals:   11/09/16 1231  BP: (!) 148/67  Resp: 18  Temp: 36.5 C    Last Pain:  Vitals:   11/09/16 1231  TempSrc: Oral         Complications: No apparent anesthesia complications

## 2016-11-09 NOTE — Anesthesia Procedure Notes (Signed)
Procedure Name: MAC Date/Time: 11/09/2016 1:21 PM Performed by: Teressa Lower Pre-anesthesia Checklist: Patient identified, Emergency Drugs available, Suction available, Patient being monitored and Timeout performed Patient Re-evaluated:Patient Re-evaluated prior to inductionOxygen Delivery Method: Simple face mask Intubation Type: IV induction Placement Confirmation: positive ETCO2 Dental Injury: Teeth and Oropharynx as per pre-operative assessment

## 2016-11-09 NOTE — Anesthesia Preprocedure Evaluation (Signed)
Anesthesia Evaluation  Patient identified by MRN, date of birth, ID band Patient confused    Reviewed: Allergy & Precautions, NPO status   Airway Mallampati: II   Neck ROM: Full    Dental   Pulmonary former smoker,    breath sounds clear to auscultation       Cardiovascular hypertension, + CAD, + Cardiac Stents and +CHF   Rhythm:Regular Rate:Normal     Neuro/Psych    GI/Hepatic GERD  ,  Endo/Other  diabetes  Renal/GU      Musculoskeletal   Abdominal   Peds  Hematology   Anesthesia Other Findings   Reproductive/Obstetrics                             Anesthesia Physical Anesthesia Plan  ASA: III  Anesthesia Plan: MAC   Post-op Pain Management:    Induction: Intravenous  Airway Management Planned: Natural Airway and Nasal Cannula  Additional Equipment:   Intra-op Plan:   Post-operative Plan: Extubation in OR  Informed Consent: I have reviewed the patients History and Physical, chart, labs and discussed the procedure including the risks, benefits and alternatives for the proposed anesthesia with the patient or authorized representative who has indicated his/her understanding and acceptance.     Plan Discussed with:   Anesthesia Plan Comments:         Anesthesia Quick Evaluation

## 2016-11-09 NOTE — Op Note (Signed)
[facility] Patient Name: Gregory Ortega Procedure Date : 11/09/2016 MRN: [id number] Attending MD: Nancy Fetter Dr., MD Date of Birth: 08/05/1945 CSN: [id number] Age: 71 Admit Type: Inpatient Procedure:                Colonoscopy Indications:              Gastrointestinal occult blood loss, Abnormal                            virtual colonoscopy suggesting polyp in descending                            colon at 60 cm Providers:                Jeneen Rinks L. Jonn Chaikin Dr., MD, Carolynn Comment, RN,                            Cherylynn Ridges, Technician, Rhae Lerner, CRNA Referring MD:             Dr Bea Graff Medicines:                Monitored Anesthesia Care Complications:            No immediate complications. Estimated Blood Loss:     Estimated blood loss: none. Procedure:                Pre-Anesthesia Assessment:                           - Prior to the procedure, a History and Physical                            was performed, and patient medications and                            allergies were reviewed. The patient's tolerance of                            previous anesthesia was also reviewed. The risks                            and benefits of the procedure and the sedation                            options and risks were discussed with the patient.                            All questions were answered, and informed consent                            was obtained. Prior Anticoagulants: The patient has                            taken Plavix (clopidogrel), last dose was 3 days  prior to procedure. ASA Grade Assessment: III - A                            patient with severe systemic disease. After                            reviewing the risks and benefits, the patient was                            deemed in satisfactory condition to undergo the                            procedure.                           After obtaining informed consent,  the colonoscope                            was passed under direct vision. Throughout the                            procedure, the patient's blood pressure, pulse, and                            oxygen saturations were monitored continuously. The                            EC-[id number] ([id number]) scope was introduced through                            the anus and advanced to the the cecum, identified                            by appendiceal orifice and ileocecal valve. The                            colonoscopy was performed without difficulty. The                            patient tolerated the procedure well. The quality                            of the bowel preparation was good. The ileocecal                            valve, appendiceal orifice, and rectum were                            photographed. Scope In: 1:29:17 PM Scope Out: 2:08:36 PM Scope Withdrawal Time: 0 hours 28 minutes 43 seconds  Total Procedure Duration: 0 hours 39 minutes 19 seconds  Findings:      The perianal and digital rectal examinations were normal.      There is no endoscopic evidence of mass, polyps or tumor in the entire  colon. we passed through the descending colon with the patient in the       left lateral decubitus position and then cocked him over nearly to the       supine position in which back up to the transverse colon and withdrew.       There was no sign of any polyp in this area on both withdrawals.      Scattered small-mouthed diverticula were found in the sigmoid colon and       descending colon.      Non-bleeding internal hemorrhoids were found during retroflexion. The       hemorrhoids were small and Grade I (internal hemorrhoids that do not       prolapse). Impression:               - Diverticulosis in the sigmoid colon and in the                            descending colon.                           - Non-bleeding internal hemorrhoids.                           - No specimens  collected.                           - Abnormal Imaging normal endoscopic exam.no signs                            of polyp on colonoscopy                           - Blood found in stool Moderate Sedation:      MAC by anesthesia Recommendation:           - Patient has a contact number available for                            emergencies. The signs and symptoms of potential                            delayed complications were discussed with the                            patient. Return to normal activities tomorrow.                            Written discharge instructions were provided to the                            patient.                           - Resume previous diet.                           - Continue present medications.                           -  Resume Plavix (clopidogrel) at prior dose today.                           - No repeat colonoscopy due to age.                           - Return to endoscopist in 3 months. Procedure Code(s):        --- Professional ---                           [phone], Colonoscopy, flexible; diagnostic, including                            collection of specimen(s) by brushing or washing,                            when performed (separate procedure) Diagnosis Code(s):        --- Professional ---                           R93.3, Abnormal findings on diagnostic imaging of                            other parts of digestive tract                           R19.5, Other fecal abnormalities                           K57.30, Diverticulosis of large intestine without                            perforation or abscess without bleeding                           K64.0, First degree hemorrhoids CPT copyright 2016 [employer]. All rights reserved. The codes documented in this report are preliminary and upon coder review may  be revised to meet current compliance requirements. Nancy Fetter Dr., MD 11/09/2016 2:17:18 PM This report has  been signed electronically. Number of Addenda: 0

## 2016-11-09 NOTE — H&P (Signed)
Subjective:   Patient is a 71 y.o. male presents with A history of stool positive for FOB. He had a previous colonoscopy in 2011 done in [place] that is reported to be negative. He has been on Plavix because of the history of ischemic strokes. Dr. Anne Hahn felt could hold this for colonoscopy and started back as soon as possible. We ended up because of his history of strokes doing a virtual colonoscopy which showed a 7 mm polypoid filling defect on the anterior wall descending: 60 cm from the rectum which was felt to be a polyp. No other gross lesions were seen. After discussion with the patient and his wife as well as other physicians caring for him he comes in now for colonoscopy.. Procedure including risks and benefits discussed in office.  Patient Active Problem List   Diagnosis Date Noted  . Bilateral carpal tunnel syndrome 01/12/2016  . TIA (transient ischemic attack) 12/15/2015  . Type 2 diabetes mellitus (HCC) 12/15/2015  . GERD (gastroesophageal reflux disease) 12/15/2015  . Benign essential HTN 12/15/2015  . Coronary artery disease without angina pectoris 12/15/2015  . HLD (hyperlipidemia) 12/15/2015  . Alzheimer's disease 11/11/2014  . Memory difficulty 04/22/2013   Past Medical History:  Diagnosis Date  . Alzheimer's disease 11/11/2014  . Anemia   . Bilateral carpal tunnel syndrome 01/12/2016  . Colon polyp   . Coronary atherosclerosis of unspecified type of vessel, native or graft   . Disturbance of skin sensation   . Esophageal reflux   . Hypertension   . Memory difficulty 04/22/2013  . Obesity, unspecified   . Other B-complex deficiencies   . Type II or unspecified type diabetes mellitus with neurological manifestations, not stated as uncontrolled(250.60)   . Type II or unspecified type diabetes mellitus without mention of complication, not stated as uncontrolled   . Unspecified hereditary and idiopathic peripheral neuropathy     Past Surgical History:  Procedure  Laterality Date  . BY-PASS SURGERY  2005  . COLONOSCOPY    . CORONARY STENT PLACEMENT  2017  . IR GENERIC HISTORICAL  07/27/2016   IR ANGIO INTRA EXTRACRAN SEL COM CAROTID INNOMINATE BILAT MOD SED 07/27/2016 Julieanne Cotton, MD [facility]  . IR GENERIC HISTORICAL  07/27/2016   IR ANGIO VERTEBRAL SEL SUBCLAVIAN INNOMINATE UNI R MOD SED 07/27/2016 Julieanne Cotton, MD [facility]  . IR GENERIC HISTORICAL  07/27/2016   IR ANGIO VERTEBRAL SEL VERTEBRAL UNI L MOD SED 07/27/2016 Julieanne Cotton, MD [facility]  . MOLES REMOVAL  2013    Prescriptions Prior to Admission  Medication Sig Dispense Refill Last Dose  . aspirin EC 81 MG tablet Take 81 mg by mouth every morning.   Past Week at Unknown time  . atorvastatin (LIPITOR) 10 MG tablet Take 5 mg by mouth at bedtime.   Past Week at Unknown time  . clopidogrel (PLAVIX) 75 MG tablet Take 75 mg by mouth daily.   Past Week at Unknown time  . co-enzyme Q-10 50 MG capsule Take 200 mg by mouth every morning.    Past Week at Unknown time  . docusate sodium (COLACE) 100 MG capsule Take 100 mg by mouth 2 (two) times daily.   Past Week at Unknown time  . memantine (NAMENDA XR) 28 MG CP24 24 hr capsule Take 1 capsule (28 mg total) by mouth daily. 90 capsule 3 11/09/2016 at 0500  . Omega-3 Fatty Acids (SALMON OIL-1000) 200 MG CAPS Take 1 capsule by mouth 2 (two) times daily.  Past Week at Unknown time  . omeprazole (PRILOSEC) 20 MG capsule Take 20 mg by mouth every morning.    11/09/2016 at 0500  . ranolazine (RANEXA) 1000 MG SR tablet Take 1,000 mg by mouth 2 (two) times daily.   11/09/2016 at 0500  . vitamin B-12 (CYANOCOBALAMIN) 1000 MCG tablet Take 1,000 mcg by mouth daily.   Past Week at Unknown time  . vitamin E 400 UNIT capsule Take 400 Units by mouth daily.   Past Week at Unknown time  . carvedilol (COREG) 6.25 MG tablet Take 3.125 mg by mouth See admin instructions. Pt to take twice daily ONLY IF BLOOD PRESSURE IS OVER 160   More than a month at  Unknown time  . fexofenadine (ALLEGRA) 180 MG tablet Take 180 mg by mouth daily as needed for allergies.    More than a month at Unknown time  . nitroGLYCERIN (NITROSTAT) 0.4 MG SL tablet Place 0.4 mg under the tongue every 5 (five) minutes as needed for chest pain.   More than a month at Unknown time   Allergies  Allergen Reactions  . Statins Other (See Comments)    Confusion, memory loss and couldn't concentrate   . Ambien [Zolpidem Tartrate] Other (See Comments)    irritability  . Effient [Prasugrel Hcl]     " bleeds too much"  . Rosuvastatin Calcium Other (See Comments)    Confusion, memory loss and couldn't concentrate   . Zetia [Ezetimibe] Other (See Comments)    Blood sugar too high    Social History  Substance Use Topics  . Smoking status: Former Games developer  . Smokeless tobacco: Never Used  . Alcohol use No    Family History  Problem Relation Age of Onset  . Hypertension Mother   . Alzheimer's disease Mother   . Dementia Mother   . Cancer Brother   . Heart attack Brother   . Kidney disease Father   . Alzheimer's disease Father   . Rheum arthritis Sister      Objective:   Patient Vitals for the past 8 hrs:  BP Temp Temp src Resp SpO2 Height Weight  11/09/16 1231 (!) 148/67 97.7 F (36.5 C) Oral 18 98 % 5' 7.5" (1.715 m) 69.4 kg (153 lb)   No intake/output data recorded. No intake/output data recorded.   See MD Preop evaluation      Assessment:   1. Abnormal virtual colonoscopy suggested polyp in the descending colon. 2. History of TIAs 7 ischemic strokes 3. Type II diabetes  Plan:   We will go ahead and proceed with colonoscopy and polypectomy using propofol sedation. Has been discussed in detail with the patient and his wife in the office.

## 2016-11-09 NOTE — Discharge Instructions (Signed)

## 2016-11-12 ENCOUNTER — Encounter (HOSPITAL_COMMUNITY): Payer: Self-pay | Admitting: Gastroenterology

## 2016-11-13 NOTE — Anesthesia Postprocedure Evaluation (Addendum)
Anesthesia Post Note  Patient: Gregory Ortega  Procedure(s) Performed: Procedure(s) (LRB): COLONOSCOPY (N/A)  Patient location during evaluation: Endoscopy Anesthesia Type: MAC Level of consciousness: awake and alert Pain management: pain level controlled Vital Signs Assessment: post-procedure vital signs reviewed and stable Respiratory status: spontaneous breathing, nonlabored ventilation, respiratory function stable and patient connected to nasal cannula oxygen Cardiovascular status: stable and blood pressure returned to baseline Anesthetic complications: no       Last Vitals:  Vitals:   11/09/16 1231 11/09/16 1412  BP: (!) 148/67 140/63  Pulse:  61  Resp: 18 15  Temp: 36.5 C 36.4 C    Last Pain:  Vitals:   11/09/16 1412  TempSrc: Oral                 Hagar Sadiq,JAMES TERRILL

## 2016-11-25 DIAGNOSIS — F0150 Vascular dementia without behavioral disturbance: Secondary | ICD-10-CM | POA: Diagnosis not present

## 2016-11-25 DIAGNOSIS — I2510 Atherosclerotic heart disease of native coronary artery without angina pectoris: Secondary | ICD-10-CM

## 2016-11-25 DIAGNOSIS — I10 Essential (primary) hypertension: Secondary | ICD-10-CM | POA: Diagnosis not present

## 2016-11-25 DIAGNOSIS — K219 Gastro-esophageal reflux disease without esophagitis: Secondary | ICD-10-CM | POA: Diagnosis not present

## 2016-11-25 DIAGNOSIS — E119 Type 2 diabetes mellitus without complications: Secondary | ICD-10-CM | POA: Diagnosis not present

## 2016-11-25 DIAGNOSIS — E785 Hyperlipidemia, unspecified: Secondary | ICD-10-CM | POA: Diagnosis not present

## 2016-11-25 DIAGNOSIS — R55 Syncope and collapse: Secondary | ICD-10-CM

## 2016-11-25 DIAGNOSIS — R001 Bradycardia, unspecified: Secondary | ICD-10-CM

## 2016-11-25 DIAGNOSIS — I951 Orthostatic hypotension: Secondary | ICD-10-CM

## 2016-11-26 DIAGNOSIS — E119 Type 2 diabetes mellitus without complications: Secondary | ICD-10-CM | POA: Diagnosis not present

## 2016-11-26 DIAGNOSIS — R55 Syncope and collapse: Secondary | ICD-10-CM | POA: Diagnosis not present

## 2016-11-26 DIAGNOSIS — R001 Bradycardia, unspecified: Secondary | ICD-10-CM | POA: Diagnosis not present

## 2016-11-26 DIAGNOSIS — K219 Gastro-esophageal reflux disease without esophagitis: Secondary | ICD-10-CM | POA: Diagnosis not present

## 2016-11-26 DIAGNOSIS — E785 Hyperlipidemia, unspecified: Secondary | ICD-10-CM | POA: Diagnosis not present

## 2016-11-26 DIAGNOSIS — I2510 Atherosclerotic heart disease of native coronary artery without angina pectoris: Secondary | ICD-10-CM | POA: Diagnosis not present

## 2016-11-26 DIAGNOSIS — F0150 Vascular dementia without behavioral disturbance: Secondary | ICD-10-CM | POA: Diagnosis not present

## 2016-11-26 DIAGNOSIS — I951 Orthostatic hypotension: Secondary | ICD-10-CM | POA: Diagnosis not present

## 2016-11-26 DIAGNOSIS — I10 Essential (primary) hypertension: Secondary | ICD-10-CM | POA: Diagnosis not present

## 2016-12-13 ENCOUNTER — Ambulatory Visit (INDEPENDENT_AMBULATORY_CARE_PROVIDER_SITE_OTHER): Payer: Medicare Other | Admitting: Neurology

## 2016-12-13 ENCOUNTER — Encounter: Payer: Self-pay | Admitting: Neurology

## 2016-12-13 VITALS — BP 149/73 | HR 63 | Ht 67.5 in | Wt 176.5 lb

## 2016-12-13 DIAGNOSIS — R413 Other amnesia: Secondary | ICD-10-CM

## 2016-12-13 MED ORDER — MEMANTINE HCL ER 28 MG PO CP24: 28.0000 mg | ORAL_CAPSULE | Freq: Every day | ORAL | 3 refills | Status: DC

## 2016-12-13 NOTE — Progress Notes (Signed)
Reason for visit: Alzheimer's disease  Gregory RuddyVirgil Ortega is an 71 y.o. male  History of present illness:  Gregory Ortega is a 71 year old left-handed white male with a history of Alzheimer's disease. He continues to worsen with his memory, he remains on Namenda. He has had some problems with dizziness with standing, he had a blackout episode on 11/25/2016. He has a cardiac monitor in place. 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 The patient has had some mild drops in blood pressure from sitting to standing. He is staying well-hydrated. The patient has required some assistance with bathing and dressing, he is having difficulty telling when he needs to have a bowel movement. He is not very active during the day. The patient is having increasing problems with apraxia. He remains on low-dose aspirin.  Past Medical History:  Diagnosis Date  . Alzheimer's disease 11/11/2014  . Anemia   . Bilateral carpal tunnel syndrome 01/12/2016  . Colon polyp   . Coronary atherosclerosis of unspecified type of vessel, native or graft   . Disturbance of skin sensation   . Esophageal reflux   . Hypertension   . Memory difficulty 04/22/2013  . Obesity, unspecified   . Other B-complex deficiencies   . Type II or unspecified type diabetes mellitus with neurological manifestations, not stated as uncontrolled(250.60)   . Type II or unspecified type diabetes mellitus without mention of complication, not stated as uncontrolled   . Unspecified hereditary and idiopathic peripheral neuropathy     Past Surgical History:  Procedure Laterality Date  . BY-PASS SURGERY  2005  . COLONOSCOPY    . COLONOSCOPY N/A 11/09/2016   Procedure: COLONOSCOPY;  Surgeon: Carman ChingJames Edwards, MD;  [facility];  Service: Endoscopy;  Laterality: N/A;  . CORONARY  STENT PLACEMENT  2017  . IR GENERIC HISTORICAL  07/27/2016   IR ANGIO INTRA EXTRACRAN SEL COM CAROTID INNOMINATE BILAT MOD SED 07/27/2016 Julieanne CottonSanjeev Deveshwar, MD [facility]  . IR GENERIC HISTORICAL  07/27/2016   IR ANGIO VERTEBRAL SEL SUBCLAVIAN INNOMINATE UNI R MOD SED 07/27/2016 Julieanne CottonSanjeev Deveshwar, MD [facility]  . IR GENERIC HISTORICAL  07/27/2016   IR ANGIO VERTEBRAL SEL VERTEBRAL UNI L MOD SED 07/27/2016 Julieanne CottonSanjeev Deveshwar, MD [facility]  . MOLES REMOVAL  2013    Family History  Problem Relation Age of Onset  . Hypertension Mother   . Alzheimer's disease Mother   . Dementia Mother   . Cancer Brother   . Heart attack Brother   . Kidney disease Father   . Alzheimer's disease Father   . Rheum arthritis Sister     Social history:  reports that he has quit smoking. He has never used smokeless tobacco. He reports that he does not drink alcohol or use drugs.    Allergies  Allergen Reactions  . Statins Other (See Comments)    Confusion, memory loss and couldn't concentrate   . Ambien [Zolpidem Tartrate] Other (See Comments)    irritability  . Effient [Prasugrel Hcl]     " bleeds too much"  . Rosuvastatin Calcium Other (See Comments)    Confusion, memory loss and couldn't concentrate   . Zetia [Ezetimibe] Other (See Comments)    Blood sugar too high    Medications:  Prior to Admission medications   Medication Sig Start Date End Date Taking? Authorizing Provider  aspirin  EC 81 MG tablet Take 81 mg by mouth every morning.   Yes [provider]  atorvastatin (LIPITOR) 10 MG tablet Take 5 mg by mouth at bedtime.   Yes [provider]  carvedilol (COREG) 6.25 MG tablet Take 3.125 mg by mouth See admin instructions. Pt to take twice daily ONLY IF BLOOD PRESSURE IS OVER 160   Yes [provider]  clopidogrel (PLAVIX) 75 MG tablet Take 75 mg by mouth daily.   Yes [provider]  co-enzyme Q-10 50 MG capsule Take 200 mg by mouth every morning.     Yes [provider]  docusate sodium (COLACE) 100 MG capsule Take 100 mg by mouth 2 (two) times daily.   Yes [provider]  fexofenadine (ALLEGRA) 180 MG tablet Take 180 mg by mouth daily as needed for allergies.    Yes [provider]  memantine (NAMENDA XR) 28 MG CP24 24 hr capsule Take 1 capsule (28 mg total) by mouth daily. 06/13/16  Yes York Spaniel, MD  nitroGLYCERIN (NITROSTAT) 0.4 MG SL tablet Place 0.4 mg under the tongue every 5 (five) minutes as needed for chest pain.   Yes [provider]  Omega-3 Fatty Acids (SALMON OIL-1000) 200 MG CAPS Take 1 capsule by mouth 2 (two) times daily.    Yes [provider]  omeprazole (PRILOSEC) 20 MG capsule Take 20 mg by mouth every morning.    Yes [provider]  vitamin B-12 (CYANOCOBALAMIN) 1000 MCG tablet Take 1,000 mcg by mouth daily.   Yes [provider]  vitamin E 400 UNIT capsule Take 400 Units by mouth daily.   Yes [provider]    ROS:  Out of a complete 14 system review of symptoms, the patient complains only of the following symptoms, and all other reviewed systems are negative.  Chills, fatigue, weight loss Shortness of breath Heart murmur Cold intolerance, excessive thirst Black stools Difficulty urinating Bruising easily, anemia Memory loss, dizziness, weakness, passing out Confusion  Blood pressure (!) 149/73, pulse 63, height 5' 7.5" (1.715 m), weight 176 lb 8 oz (80.1 kg).   Blood pressure, right arm, standing is 128/68. Blood pressure, right arm, sitting is 152/80.  Physical Exam  General: The patient is alert and cooperative at the time of the examination.  Skin: No significant peripheral edema is noted.   Neurologic Exam  Mental status: The patient is alert and oriented x 2 at the time of the examination (not oriented to date). The Mini-Mental Status Examination done today shows a total score of 12/30.   Cranial nerves: Facial  symmetry is present. Speech is normal, no aphasia or dysarthria is noted. Extraocular movements are full. Visual fields are full.  Motor: The patient has good strength in all 4 extremities.  Sensory examination: Soft touch sensation is symmetric on the face, arms, and legs.  Coordination: The patient has severe apraxia with performing finger-nose-finger and heel-to-shin bilaterally.  Gait and station: The patient has a normal gait. Tandem gait is normal. Romberg is negative. No drift is seen.  Reflexes: Deep tendon reflexes are symmetric.   Angiogram cerebral 09/13/16:  IMPRESSION: Interval progression of a stenoses angiographically of the right right petrous cavernous junction to 77 5%, and possibly the of the supraclinoid right ICA to 50-60%.  Interval progression of so the severe stenoses involving the right posterior cerebral artery P1 P2 junction.  Approximately 50% stenosis of the left internal carotid artery supraclinoid segment. High-grade stenosis of the  right posterior communicating artery at its origin.  Approximately 50% stenosis of the right middle cerebral artery at   Assessment/Plan:  1. Alzheimer's disease  2. Mild orthostatic hypotension  3. Cerebrovascular disease  The patient currently has a 14 day cardiac monitor on. The patient is having episodes of dizziness with standing, there may be a mild drop in blood pressure in conjunction with intracranial atherosclerotic changes. The patient is staying well-hydrated. 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 The wife does not believe that the generic extended release Namenda is as effective as brand name. A prescription was written for the brand name medication. The patient will follow-up in 6 months.   Marlan Palau MD 12/13/2016 10:27 AM  [facility] [address] [place], [place] [address]  Phone [phone] Fax [phone]

## 2016-12-14 NOTE — Addendum Note (Signed)
Addendum  created 12/14/16 1425 by Karnell Vanderloop, MD   Sign clinical note    

## 2017-01-16 ENCOUNTER — Encounter (HOSPITAL_COMMUNITY): Payer: Self-pay

## 2017-01-16 ENCOUNTER — Emergency Department (HOSPITAL_COMMUNITY)
Admission: EM | Admit: 2017-01-16 | Discharge: 2017-01-16 | Disposition: A | Discharge status: Home / Self Care | Payer: Medicare Other | Attending: Emergency Medicine | Admitting: Emergency Medicine

## 2017-01-16 DIAGNOSIS — K602 Anal fissure, unspecified: Secondary | ICD-10-CM

## 2017-01-16 DIAGNOSIS — D649 Anemia, unspecified: Secondary | ICD-10-CM | POA: Diagnosis not present

## 2017-01-16 DIAGNOSIS — I2510 Atherosclerotic heart disease of native coronary artery without angina pectoris: Secondary | ICD-10-CM | POA: Diagnosis not present

## 2017-01-16 DIAGNOSIS — E119 Type 2 diabetes mellitus without complications: Secondary | ICD-10-CM | POA: Insufficient documentation

## 2017-01-16 DIAGNOSIS — K6289 Other specified diseases of anus and rectum: Secondary | ICD-10-CM | POA: Diagnosis not present

## 2017-01-16 DIAGNOSIS — I10 Essential (primary) hypertension: Secondary | ICD-10-CM | POA: Insufficient documentation

## 2017-01-16 DIAGNOSIS — Z87891 Personal history of nicotine dependence: Secondary | ICD-10-CM | POA: Insufficient documentation

## 2017-01-16 LAB — COMPREHENSIVE METABOLIC PANEL
ALBUMIN: 3.6 g/dL (ref 3.5–5.0)
ALK PHOS: 59 U/L (ref 38–126)
ALT: 13 U/L — ABNORMAL LOW (ref 17–63)
ANION GAP: 8 (ref 5–15)
AST: 18 U/L (ref 15–41)
BILIRUBIN TOTAL: 1 mg/dL (ref 0.3–1.2)
BUN: 7 mg/dL (ref 6–20)
CO2: 22 mmol/L (ref 22–32)
Calcium: 8.6 mg/dL — ABNORMAL LOW (ref 8.9–10.3)
Chloride: 101 mmol/L (ref 101–111)
Creatinine, Ser: 0.8 mg/dL (ref 0.61–1.24)
GFR calc Af Amer: >60 mL/min (ref >60)
GLUCOSE: 102 mg/dL — AB (ref 65–99)
Potassium: 3.7 mmol/L (ref 3.5–5.1)
Sodium: 131 mmol/L — ABNORMAL LOW (ref 135–145)
TOTAL PROTEIN: 5.8 g/dL — AB (ref 6.5–8.1)

## 2017-01-16 LAB — CBC WITH DIFFERENTIAL/PLATELET
BASOS ABS: 0 10*3/uL (ref 0.0–0.1)
BASOS PCT: 1 %
EOS PCT: 2 %
Eosinophils Absolute: 0.1 10*3/uL (ref 0.0–0.7)
HCT: 33.1 % — ABNORMAL LOW (ref 39.0–52.0)
Hemoglobin: 11.2 g/dL — ABNORMAL LOW (ref 13.0–17.0)
Lymphocytes Relative: 22 %
Lymphs Abs: 1.2 10*3/uL (ref 0.7–4.0)
MCH: 29.3 pg (ref 26.0–34.0)
MCHC: 33.8 g/dL (ref 30.0–36.0)
MCV: 86.6 fL (ref 78.0–100.0)
MONO ABS: 0.5 10*3/uL (ref 0.1–1.0)
Monocytes Relative: 10 %
NEUTROS ABS: 3.7 10*3/uL (ref 1.7–7.7)
Neutrophils Relative %: 65 %
PLATELETS: 235 10*3/uL (ref 150–400)
RBC: 3.82 MIL/uL — ABNORMAL LOW (ref 4.22–5.81)
RDW: 14.6 % (ref 11.5–15.5)
WBC: 5.5 10*3/uL (ref 4.0–10.5)

## 2017-01-16 LAB — URINALYSIS, ROUTINE W REFLEX MICROSCOPIC
Bilirubin Urine: NEGATIVE
Glucose, UA: NEGATIVE mg/dL
Hgb urine dipstick: NEGATIVE
Ketones, ur: NEGATIVE mg/dL
Leukocytes, UA: NEGATIVE
Nitrite: NEGATIVE
Protein, ur: NEGATIVE mg/dL
Specific Gravity, Urine: 1.011 (ref 1.005–1.030)
pH: 8 (ref 5.0–8.0)

## 2017-01-16 LAB — POC OCCULT BLOOD, ED: Fecal Occult Bld: NEGATIVE

## 2017-01-16 MED ORDER — DOCUSATE SODIUM 100 MG PO CAPS: 100.0000 mg | ORAL_CAPSULE | Freq: Two times a day (BID) | ORAL | 0 refills | Status: AC

## 2017-01-16 NOTE — ED Triage Notes (Signed)
Patient complains of ongoing rectal pain x 2 weeks. Has been seen by primary MD, Urology and ED. Has been put on different meds thinks maybe prostate problems with no relief. Alert with dementia.

## 2017-01-16 NOTE — Discharge Instructions (Signed)
You were seen in the ED today with rectal pain. We found a small skin tare near the rectum that is likely causing your pain. Continue the antibiotics prescribed previously. Take a sitz bath as instructed along with continuing stool softeners.   Return to the ED with and worsening pain, fever, chills, or rectal bleeding.

## 2017-01-16 NOTE — ED Provider Notes (Signed)
Emergency Department Provider Note  By signing my name below, I, Soijett Blue, attest that this documentation has been prepared under the direction and in the presence of Sonnia Strong, Arlyss Repress, MD. Electronically Signed: Soijett Blue, ED Scribe. 01/16/17. 1:29 PM.  I have reviewed the triage vital signs and the nursing notes.   HISTORY  Chief Complaint Rectal Pain  HPI Gregory Ortega is a 71 y.o. male with a PMHx of alzheimers, HTN, DM, who presents to the Emergency Department complaining of rectal pain onset 2 weeks worsening this morning. Pt reports associated lower abdominal pain and dysuria. Pt has tried uribid BID x 2 days and doxazosin with no relief of his symptoms. He notes that he has been evaluated by his PCP, urologist, and in the [facility] ED with no success in treating his symptoms. Wife reports that the pt had a CT scan completed at [facility] ED with minor abnormalities to his bladder and urethra. Wife notes that the pt has had his prostate evaluated by Dr. Annabell Howells and was given prescription doxazosin. Wife states that they are unsure if the pt rectal pain is due to his prostate or other issues. Wife reports that the pt was informed that he had internal hemorrhoids in the past. He denies blood in stool, diarrhea, fever, chills, and any other symptoms.     Past Medical History:  Diagnosis Date  . Alzheimer's disease 11/11/2014  . Anemia   . Bilateral carpal tunnel syndrome 01/12/2016  . Colon polyp   . Coronary atherosclerosis of unspecified type of vessel, native or graft   . Disturbance of skin sensation   . Esophageal reflux   . Hypertension   . Memory difficulty 04/22/2013  . Obesity, unspecified   . Other B-complex deficiencies   . Type II or unspecified type diabetes mellitus with neurological manifestations, not stated as uncontrolled(250.60)   . Type II or unspecified type diabetes mellitus without mention of complication, not stated as uncontrolled   . Unspecified  hereditary and idiopathic peripheral neuropathy     Patient Active Problem List   Diagnosis Date Noted  . Bilateral carpal tunnel syndrome 01/12/2016  . TIA (transient ischemic attack) 12/15/2015  . Type 2 diabetes mellitus (HCC) 12/15/2015  . GERD (gastroesophageal reflux disease) 12/15/2015  . Benign essential HTN 12/15/2015  . Coronary artery disease without angina pectoris 12/15/2015  . HLD (hyperlipidemia) 12/15/2015  . Alzheimer disease 11/11/2014  . Memory difficulty 04/22/2013    Past Surgical History:  Procedure Laterality Date  . BY-PASS SURGERY  2005  . COLONOSCOPY    . COLONOSCOPY N/A 11/09/2016   Procedure: COLONOSCOPY;  Surgeon: Carman Ching, MD;  Location: [facility] ENDOSCOPY;  Service: Endoscopy;  Laterality: N/A;  . CORONARY STENT PLACEMENT  2017  . IR GENERIC HISTORICAL  07/27/2016   IR ANGIO INTRA EXTRACRAN SEL COM CAROTID INNOMINATE BILAT MOD SED 07/27/2016 Julieanne Cotton, MD [facility]INTER[facility]  . IR GENERIC HISTORICAL  07/27/2016   IR ANGIO VERTEBRAL SEL SUBCLAVIAN INNOMINATE UNI R MOD SED 07/27/2016 Julieanne Cotton, MD [facility]  . IR GENERIC HISTORICAL  07/27/2016   IR ANGIO VERTEBRAL SEL VERTEBRAL UNI L MOD SED 07/27/2016 Julieanne Cotton, MD [facility]  . MOLES REMOVAL  2013    Current Outpatient Rx  . Order #: [id number] Class: Historical Med  . Order #: [id number] Class: Historical Med  . Order #: [id number] Class: Historical Med  . Order #: [id number] Class: Historical Med  . Order #: [id number] Class: Historical Med  . Order #: [id number] Class: Print  .  Order #: [id number] Class: Historical Med  . Order #: [id number] Class: Normal  . Order #: [id number] Class: Historical Med  . Order #: [id number] Class: Historical Med  . Order #: [id number] Class: Historical Med  . Order #: [id number] Class: Historical Med  . Order #: [id number] Class: Historical Med    Allergies Statins; Ambien [zolpidem tartrate]; Effient [prasugrel hcl]; Rosuvastatin calcium; and Zetia  [ezetimibe]  Family History  Problem Relation Age of Onset  . Hypertension Mother   . Alzheimer's disease Mother   . Dementia Mother   . Cancer Brother   . Heart attack Brother   . Kidney disease Father   . Alzheimer's disease Father   . Rheum arthritis Sister     Social History Social History  Substance Use Topics  . Smoking status: Former Games developer  . Smokeless tobacco: Never Used  . Alcohol use No    Review of Systems Constitutional: No fever/chills Eyes: No visual changes. ENT: No sore throat. Cardiovascular: Denies chest pain. Respiratory: Denies shortness of breath. Gastrointestinal: Positive for rectal pain. Negative for blood in stool. Negative for diarrhea. Positive lower abdominal pain.  No nausea, no vomiting.  No diarrhea.  No constipation. Genitourinary: Positive for dysuria. Musculoskeletal: Negative for back pain. Skin: Negative for rash. Neurological: Negative for headaches, focal weakness or numbness.  10-point ROS otherwise negative.  ____________________________________________   PHYSICAL EXAM:  VITAL SIGNS: ED Triage Vitals  Enc Vitals Group     BP 01/16/17 1236 127/73     Pulse Rate 01/16/17 1236 70     Resp 01/16/17 1236 16     Temp 01/16/17 1236 97.6 F (36.4 C)     Temp Source 01/16/17 1236 Oral     SpO2 01/16/17 1236 97 %     Weight 01/16/17 1236 174 lb (78.9 kg)     Height 01/16/17 1236 5\' 6"  (1.676 m)     Pain Score 01/16/17 1234 6    Constitutional: Alert. Well appearing and in no acute distress. Eyes: Conjunctivae are normal. Head: Atraumatic. Nose: No congestion/rhinnorhea. Mouth/Throat: Mucous membranes are moist.   Neck: No stridor.   Cardiovascular: Normal rate, regular rhythm. Good peripheral circulation. Grossly normal heart sounds.   Respiratory: Normal respiratory effort.  No retractions. Lungs CTAB. Gastrointestinal: Soft and nontender. No distention. Rectal exam with no external hemorrhoids. Positive peri-rectal  fissure at the 12-o-clock position. No surrounding erythema or fluctuance. No peri-rectal abscess. Scribe chaperone present for exam.  Musculoskeletal: No lower extremity tenderness nor edema. No gross deformities of extremities. Neurologic:  Normal speech and language. No gross focal neurologic deficits are appreciated.  Skin:  Skin is warm, dry and intact. No rash noted.   ____________________________________________   LABS (all labs ordered are listed, but only abnormal results are displayed)  Labs Reviewed  COMPREHENSIVE METABOLIC PANEL - Abnormal; Notable for the following:       Result Value   Sodium 131 (*)    Glucose, Bld 102 (*)    Calcium 8.6 (*)    Total Protein 5.8 (*)    ALT 13 (*)    All other components within normal limits  CBC WITH DIFFERENTIAL/PLATELET - Abnormal; Notable for the following:    RBC 3.82 (*)    Hemoglobin 11.2 (*)    HCT 33.1 (*)    All other components within normal limits  URINE CULTURE  URINALYSIS, ROUTINE W REFLEX MICROSCOPIC  POC OCCULT BLOOD, ED   ____________________________________________  RADIOLOGY  None ____________________________________________   PROCEDURES  Procedure(s) performed:   Procedures  None ____________________________________________   INITIAL IMPRESSION / ASSESSMENT AND PLAN / ED COURSE  Pertinent labs & imaging results that were available during my care of the patient were reviewed by me and considered in my medical decision making (see chart for details).  Patient presents to the ED with rectal pain over the last 1-2 weeks. No evidence of hemorrhoids or perirectal abscess. Does have a peri-rectal fissure as described above. No other abnormality on exam. Patient on Cipro for prior UTI diagnosed at outside visit. He has seen his Urologist and had a prior ED visit with CT scan. Unable to review those results here. No tenderness on exam or historical factors to prompt additional CT int he ED at this time.  Plan to follow labs and UA but suspect fissure is the cause of pain.  03:00 PM No evidence of UTI or other abnormality in lab work. Suspect that pain is 2/2 rectal fissure on exam. Recommend stool softener and sitz baths. Will follow with PCP and continue with Cipro prescribed previously for UTI.   At this time, I do not feel there is any life-threatening condition present. I have reviewed and discussed all results (EKG, imaging, lab, urine as appropriate), exam findings with patient. I have reviewed nursing notes and appropriate previous records.  I feel the patient is safe to be discharged home without further emergent workup. Discussed usual and customary return precautions. Patient and family (if present) verbalize understanding and are comfortable with this plan.  Patient will follow-up with their primary care provider. If they do not have a primary care provider, information for follow-up has been provided to them. All questions have been answered.  ____________________________________________  FINAL CLINICAL IMPRESSION(S) / ED DIAGNOSES  Final diagnoses:  Rectal pain  Rectal fissure     MEDICATIONS GIVEN DURING THIS VISIT:  Medications - No data to display   NEW OUTPATIENT MEDICATIONS STARTED DURING THIS VISIT:  None   Note:  This document was prepared using Dragon voice recognition software and may include unintentional dictation errors.  Alona BeneJoshua Tandre Conly, MD Emergency Medicine  I personally performed the services described in this documentation, which was scribed in my presence. The recorded information has been reviewed and is accurate.       Maia PlanLong, Melvinia Ashby G, MD 01/17/17 [phone]

## 2017-01-17 LAB — URINE CULTURE
Culture: NO GROWTH
Special Requests: NORMAL

## 2017-01-28 ENCOUNTER — Telehealth: Payer: Self-pay | Admitting: Neurology

## 2017-01-28 NOTE — Telephone Encounter (Signed)
I called the patient. The cardiac monitor Rinaldo Cloudamela was unremarkable, the patient is having episodes of dizziness that may be related to minor variations of blood pressure associated with intracranial atherosclerosis.  The family is to contact me if any further questions arise concerning this issue.  The patient has about 78% stenosis of the right petrous cavernous carotid artery, 50-60% of the right supraclinoid internal carotid artery.  Severe stenosis of the right posterior cerebral artery and the P1 and P2 junction noted.  50% stenosis of the left internal carotid artery and the supraclinoid segment.  High-grade stenosis of the right posterior communicating artery, 50% stenosis of the right middle cerebral artery at its origin.

## 2017-01-28 NOTE — Telephone Encounter (Signed)
Patients wife called office in reference to patient having a heart monitor on with results never showing anything on the heart.  [facility] with [facility].

## 2017-03-22 ENCOUNTER — Emergency Department (HOSPITAL_COMMUNITY)
Admission: EM | Admit: 2017-03-22 | Discharge: 2017-03-23 | Disposition: A | Discharge status: Home / Self Care | Payer: Medicare Other | Attending: Emergency Medicine | Admitting: Emergency Medicine

## 2017-03-22 ENCOUNTER — Encounter (HOSPITAL_COMMUNITY): Payer: Self-pay | Admitting: *Deleted

## 2017-03-22 DIAGNOSIS — Z7902 Long term (current) use of antithrombotics/antiplatelets: Secondary | ICD-10-CM | POA: Diagnosis not present

## 2017-03-22 DIAGNOSIS — G309 Alzheimer's disease, unspecified: Secondary | ICD-10-CM | POA: Diagnosis not present

## 2017-03-22 DIAGNOSIS — I10 Essential (primary) hypertension: Secondary | ICD-10-CM | POA: Diagnosis not present

## 2017-03-22 DIAGNOSIS — Z79899 Other long term (current) drug therapy: Secondary | ICD-10-CM | POA: Insufficient documentation

## 2017-03-22 DIAGNOSIS — E119 Type 2 diabetes mellitus without complications: Secondary | ICD-10-CM | POA: Insufficient documentation

## 2017-03-22 DIAGNOSIS — I2510 Atherosclerotic heart disease of native coronary artery without angina pectoris: Secondary | ICD-10-CM | POA: Insufficient documentation

## 2017-03-22 DIAGNOSIS — Z87891 Personal history of nicotine dependence: Secondary | ICD-10-CM | POA: Diagnosis not present

## 2017-03-22 DIAGNOSIS — Z7982 Long term (current) use of aspirin: Secondary | ICD-10-CM | POA: Insufficient documentation

## 2017-03-22 DIAGNOSIS — Z8673 Personal history of transient ischemic attack (TIA), and cerebral infarction without residual deficits: Secondary | ICD-10-CM | POA: Insufficient documentation

## 2017-03-22 DIAGNOSIS — R350 Frequency of micturition: Secondary | ICD-10-CM | POA: Insufficient documentation

## 2017-03-22 LAB — URINALYSIS, ROUTINE W REFLEX MICROSCOPIC
BILIRUBIN URINE: NEGATIVE
GLUCOSE, UA: NEGATIVE mg/dL
Hgb urine dipstick: NEGATIVE
KETONES UR: NEGATIVE mg/dL
LEUKOCYTES UA: NEGATIVE
Nitrite: NEGATIVE
PH: 6 (ref 5.0–8.0)
Protein, ur: NEGATIVE mg/dL
Specific Gravity, Urine: 1.003 — ABNORMAL LOW (ref 1.005–1.030)

## 2017-03-22 MED ORDER — FLAVOXATE HCL 100 MG PO TABS
100.0000 mg | ORAL_TABLET | Freq: Three times a day (TID) | ORAL | Status: DC | PRN
  Filled 2017-03-22: qty 1

## 2017-03-22 NOTE — ED Notes (Signed)
Family member states pt is wanting to leave. Wants to know why other people went back before pt.  Explained triage process.  Explained that pt is next to go back and encouraged them to stay.  Apologized for wait.

## 2017-03-22 NOTE — ED Triage Notes (Signed)
Pt here for frequent urination, pt seen [facility] today and had a ua completed per wife and was told to continue with zosyn, pt denies dysuria, denies hematuria, denies fever & chills, A&O x4

## 2017-03-22 NOTE — ED Provider Notes (Signed)
[facility]PT Provider Note   CSN: [id number] Arrival date & time: 03/22/17  1704     History   Chief Complaint Chief Complaint  Patient presents with  . Urinary Frequency    HPI Gregory Ortega is a 71 y.o. male.  Patient with history of DM2, HTN, HLD, prostate enlargement presents with urinary frequency. Per wife, who provides bulk of history, the patient is taking doxazosin 2 mg QD with relative control of urination through the day, but during the night he starts urinating more frequently, sometimes every 5 minutes. No pain with urination, hematuria, fever, testicular pain, nausea. Seen by Dr. Annabell Howells today for recheck. Wife reports better control of urinary symptoms with 4 mg doxazosin but this causes hypotension and near syncope.    The history is provided by the patient and the spouse. No language interpreter was used.  Urinary Frequency  Pertinent negatives include no abdominal pain.    Past Medical History:  Diagnosis Date  . Alzheimer's disease 11/11/2014  . Anemia   . Bilateral carpal tunnel syndrome 01/12/2016  . Colon polyp   . Coronary atherosclerosis of unspecified type of vessel, native or graft   . Disturbance of skin sensation   . Esophageal reflux   . Hypertension   . Memory difficulty 04/22/2013  . Obesity, unspecified   . Other B-complex deficiencies   . Type II or unspecified type diabetes mellitus with neurological manifestations, not stated as uncontrolled(250.60)   . Type II or unspecified type diabetes mellitus without mention of complication, not stated as uncontrolled   . Unspecified hereditary and idiopathic peripheral neuropathy     Patient Active Problem List   Diagnosis Date Noted  . Bilateral carpal tunnel syndrome 01/12/2016  . TIA (transient ischemic attack) 12/15/2015  . Type 2 diabetes mellitus (HCC) 12/15/2015  . GERD (gastroesophageal reflux disease) 12/15/2015  . Benign essential HTN 12/15/2015  . Coronary artery disease without  angina pectoris 12/15/2015  . HLD (hyperlipidemia) 12/15/2015  . Alzheimer disease 11/11/2014  . Memory difficulty 04/22/2013    Past Surgical History:  Procedure Laterality Date  . BY-PASS SURGERY  2005  . COLONOSCOPY    . COLONOSCOPY N/A 11/09/2016   Procedure: COLONOSCOPY;  Surgeon: Carman Ching, MD;  Location: [facility] ENDOSCOPY;  Service: Endoscopy;  Laterality: N/A;  . CORONARY STENT PLACEMENT  2017  . IR GENERIC HISTORICAL  07/27/2016   IR ANGIO INTRA EXTRACRAN SEL COM CAROTID INNOMINATE BILAT MOD SED 07/27/2016 Julieanne Cotton, MD [facility]  . IR GENERIC HISTORICAL  07/27/2016   IR ANGIO VERTEBRAL SEL SUBCLAVIAN INNOMINATE UNI R MOD SED 07/27/2016 Julieanne Cotton, MD [facility]  . IR GENERIC HISTORICAL  07/27/2016   IR ANGIO VERTEBRAL SEL VERTEBRAL UNI L MOD SED 07/27/2016 Julieanne Cotton, MD [facility]  . MOLES REMOVAL  2013       Home Medications    Prior to Admission medications   Medication Sig Start Date End Date Taking? Authorizing Provider  aspirin EC 81 MG tablet Take 81 mg by mouth every morning.    [provider]  atorvastatin (LIPITOR) 10 MG tablet Take 5 mg by mouth at bedtime.    [provider]  carvedilol (COREG) 6.25 MG tablet Take 3.125 mg by mouth See admin instructions. Pt to take twice daily ONLY IF BLOOD PRESSURE IS OVER 160    [provider]  clopidogrel (PLAVIX) 75 MG tablet Take 75 mg by mouth daily.    [provider]  co-enzyme Q-10 50 MG  capsule Take 200 mg by mouth every morning.     [provider]  docusate sodium (COLACE) 100 MG capsule Take 1 capsule (100 mg total) by mouth every 12 (twelve) hours. 01/16/17   Long, Arlyss Repress, MD  fexofenadine (ALLEGRA) 180 MG tablet Take 180 mg by mouth daily as needed for allergies.     [provider]  memantine (NAMENDA XR) 28 MG CP24 24 hr capsule Take 1 capsule (28 mg total) by mouth daily. Brand medically necessary 12/13/16   York Spaniel, MD   nitroGLYCERIN (NITROSTAT) 0.4 MG SL tablet Place 0.4 mg under the tongue every 5 (five) minutes as needed for chest pain.    [provider]  Omega-3 Fatty Acids (SALMON OIL-1000) 200 MG CAPS Take 1 capsule by mouth 2 (two) times daily.     [provider]  omeprazole (PRILOSEC) 20 MG capsule Take 20 mg by mouth every morning.     [provider]  vitamin B-12 (CYANOCOBALAMIN) 1000 MCG tablet Take 1,000 mcg by mouth daily.    [provider]  vitamin E 400 UNIT capsule Take 400 Units by mouth daily.    [provider]    Family History Family History  Problem Relation Age of Onset  . Hypertension Mother   . Alzheimer's disease Mother   . Dementia Mother   . Cancer Brother   . Heart attack Brother   . Kidney disease Father   . Alzheimer's disease Father   . Rheum arthritis Sister     Social History Social History  Substance Use Topics  . Smoking status: Former Games developer  . Smokeless tobacco: Never Used  . Alcohol use No     Allergies   Statins; Ambien [zolpidem tartrate]; Effient [prasugrel hcl]; Rosuvastatin calcium; and Zetia [ezetimibe]   Review of Systems Review of Systems  Constitutional: Negative for diaphoresis and fever.  Gastrointestinal: Negative for abdominal pain and nausea.  Genitourinary: Positive for frequency. Negative for dysuria, hematuria, scrotal swelling and testicular pain.  Musculoskeletal: Negative for back pain and myalgias.  Neurological: Negative for syncope and weakness.     Physical Exam Updated Vital Signs BP 129/67   Pulse (!) 57   Temp 98.6 F (37 C) (Oral)   Resp 18   Ht  (1.676 m)   Wt 81.6 kg (180 lb)   SpO2 100%   BMI 29.05 kg/m   Physical Exam  Constitutional: He is oriented to person, place, and time. He appears well-developed and well-nourished.  Neck: Normal range of motion.  Pulmonary/Chest: Effort normal.  Abdominal: Soft. He exhibits no distension and no mass. There  is no tenderness.  Genitourinary: Penis normal.  Genitourinary Comments: No scrotal swelling or testicular tenderness.   Musculoskeletal: Normal range of motion.  Lymphadenopathy:       Right: No inguinal adenopathy present.       Left: No inguinal adenopathy present.  Neurological: He is alert and oriented to person, place, and time.  Skin: Skin is warm and dry.  Psychiatric: He has a normal mood and affect.     ED Treatments / Results  Labs (all labs ordered are listed, but only abnormal results are displayed) Labs Reviewed  URINALYSIS, ROUTINE W REFLEX MICROSCOPIC - Abnormal; Notable for the following:       Result Value   Color, Urine STRAW (*)    Specific Gravity, Urine 1.003 (*)    All other components within normal limits   Results for orders placed  or performed during the hospital encounter of 03/22/17  Urinalysis, Routine w reflex microscopic- may I&O cath if menses  Result Value Ref Range   Color, Urine STRAW (A) YELLOW   APPearance CLEAR CLEAR   Specific Gravity, Urine 1.003 (L) 1.005 - 1.030   pH 6.0 5.0 - 8.0   Glucose, UA NEGATIVE NEGATIVE mg/dL   Hgb urine dipstick NEGATIVE NEGATIVE   Bilirubin Urine NEGATIVE NEGATIVE   Ketones, ur NEGATIVE NEGATIVE mg/dL   Protein, ur NEGATIVE NEGATIVE mg/dL   Nitrite NEGATIVE NEGATIVE   Leukocytes, UA NEGATIVE NEGATIVE    EKG  EKG Interpretation None       Radiology No results found.  Procedures Procedures (including critical care time)  Medications Ordered in ED Medications - No data to display   Initial Impression / Assessment and Plan / ED Course  I have reviewed the triage vital signs and the nursing notes.  Pertinent labs & imaging results that were available during my care of the patient were reviewed by me and considered in my medical decision making (see chart for details).     Bladder scan ordered to evaluate for retention in patient with history of enlarged prostate, which shows 328 cc urine.  Post void residual shows empty bladder and 350 cc output. Last urination was 45-60 minutes ago. Urine appears green c/w use of Urispaz or similar medication. Wife confirms he was started on this earlier today while at the urologist. She feels this is improving his symptoms.   Encouraged continued use of Tyler AasUrispaz but discussed foley catheter; risk of infection, use secondary to convenience rather than retention. Patient and wife decide against placement. They will continue to try using medications as provided and follow up with urology next week.   Final Clinical Impressions(s) / ED Diagnoses   Final diagnoses:  None   1. Urinary frequency  New Prescriptions New Prescriptions   No medications on file     Elpidio AnisUpstill, Nomi Rudnicki, Cordelia Poche-C 03/23/17 0014    Arby BarrettePfeiffer, Marcy, MD 04/02/17 [phone]

## 2017-03-23 MED ORDER — FLAVOXATE HCL 100 MG PO TABS: 100.0000 mg | ORAL_TABLET | Freq: Three times a day (TID) | ORAL | 0 refills | Status: DC | PRN

## 2017-03-23 NOTE — Discharge Instructions (Signed)
Continue medications as prescribed. Follow up with Dr. Annabell HowellsWrenn as needed if symptoms worsen. Return to the emergency department with any new concerns.

## 2017-04-27 ENCOUNTER — Emergency Department (HOSPITAL_COMMUNITY)
Admission: EM | Admit: 2017-04-27 | Discharge: 2017-04-27 | Discharge status: Against Medical Advice | Payer: Medicare Other | Attending: Emergency Medicine | Admitting: Emergency Medicine

## 2017-04-27 ENCOUNTER — Encounter (HOSPITAL_COMMUNITY): Payer: Self-pay | Admitting: Emergency Medicine

## 2017-04-27 DIAGNOSIS — R350 Frequency of micturition: Secondary | ICD-10-CM | POA: Insufficient documentation

## 2017-04-27 DIAGNOSIS — Z5321 Procedure and treatment not carried out due to patient leaving prior to being seen by health care provider: Secondary | ICD-10-CM | POA: Insufficient documentation

## 2017-04-27 NOTE — ED Triage Notes (Signed)
Patient been on going antibiotics for UTIs for months. Patient had urinary frequency this morning around 3 am then had some pains that have eased off.

## 2017-05-16 ENCOUNTER — Other Ambulatory Visit: Payer: Self-pay | Admitting: Urology

## 2017-05-17 ENCOUNTER — Telehealth: Payer: Self-pay | Admitting: Neurology

## 2017-05-17 NOTE — Telephone Encounter (Signed)
I called the wife.  The patient was considering a TURP, but there is a minimally invasive procedure called Urolift to open up the urethra to allow individuals to avoid with BPH.  A stenting procedure can also be done, this would be less traumatic for the patient and likely avoid significant confusion following surgery.  These procedures are outpatient in nature, the patient will go home after the procedure.

## 2017-05-17 NOTE — Telephone Encounter (Signed)
Pt's wife called said he is interested in having prostate surgery called Urolift at [facility]. She would like to discuss how could this effect his dementia. Please call

## 2017-05-31 ENCOUNTER — Inpatient Hospital Stay: Admit: 2017-05-31 | Payer: Medicare Other | Admitting: Urology

## 2017-05-31 SURGERY — TURP (TRANSURETHRAL RESECTION OF PROSTATE)
Anesthesia: General

## 2017-06-01 DIAGNOSIS — R110 Nausea: Secondary | ICD-10-CM

## 2017-06-01 DIAGNOSIS — E871 Hypo-osmolality and hyponatremia: Secondary | ICD-10-CM | POA: Diagnosis not present

## 2017-06-02 DIAGNOSIS — R110 Nausea: Secondary | ICD-10-CM | POA: Diagnosis not present

## 2017-06-02 DIAGNOSIS — E871 Hypo-osmolality and hyponatremia: Secondary | ICD-10-CM | POA: Diagnosis not present

## 2017-06-03 DIAGNOSIS — R110 Nausea: Secondary | ICD-10-CM | POA: Diagnosis not present

## 2017-06-03 DIAGNOSIS — E871 Hypo-osmolality and hyponatremia: Secondary | ICD-10-CM | POA: Diagnosis not present

## 2017-06-04 ENCOUNTER — Telehealth: Payer: Self-pay | Admitting: Neurology

## 2017-06-04 NOTE — Telephone Encounter (Signed)
This patient has a moderate level dementia.  He was just in the hospital with problems with severe hyponatremia with sodium levels in the 115 range.  He had what sounded like sundowning last evening, he is better now.  The patient is now on fluid restrictions to keep his sodium up.  The confusion in this patient is a likely manifestation of his underlying medical problems.  The patient may continue to sundown in the future, if persistent confusion is noted, the patient may require a medical evaluation.

## 2017-06-04 NOTE — Telephone Encounter (Signed)
Pt's wife called he's had prostate surgery(outpt/05/30/17), has been back to the hospital twice since then. Last night he was very confused, didn't know where he was and he was bothered by it. Today he recognizes her and their children but not the home. She is wondering if he is dehydrated or if any of his medications could be causing this. Please call to advise.

## 2017-06-17 ENCOUNTER — Encounter: Payer: Self-pay | Admitting: Adult Health

## 2017-06-17 ENCOUNTER — Encounter (INDEPENDENT_AMBULATORY_CARE_PROVIDER_SITE_OTHER): Payer: Self-pay

## 2017-06-17 ENCOUNTER — Ambulatory Visit (INDEPENDENT_AMBULATORY_CARE_PROVIDER_SITE_OTHER): Payer: Medicare Other | Admitting: Adult Health

## 2017-06-17 VITALS — BP 134/76 | HR 63 | Ht 66.0 in | Wt 166.4 lb

## 2017-06-17 DIAGNOSIS — F0280 Dementia in other diseases classified elsewhere without behavioral disturbance: Secondary | ICD-10-CM

## 2017-06-17 DIAGNOSIS — G309 Alzheimer's disease, unspecified: Secondary | ICD-10-CM

## 2017-06-17 NOTE — Progress Notes (Signed)
PATIENT: Kathrin RuddyVirgil Sweeden DOB: Sep 08, 1945  REASON FOR VISIT: follow up HISTORY FROM: patient  HISTORY OF PRESENT ILLNESS: Today 06/17/17 Mr. Kyung RuddKennedy is a 71 year old male with a history of Alzheimer's disease.  He returns today for follow-up.  The patient had prostate surgery November 15.  His wife notes that there has been some changes since his surgery.  She reports that he is not sleeping all night long.  She reports that he wakes up at 3 AM and is very chatty often seems that he is confusing his dreams with reality.  She reports that he needs assistance with ADLs.  He does not operate a motor vehicle.  Denies hallucinations.  Reports good appetite.  The wife is mainly concerned about his sleep.  He returns today for an evaluation.   HISTORY Mr. Kyung RuddKennedy is a 71 year old left-handed white male with a history of Alzheimer's disease. He continues to worsen with his memory, he remains on Namenda. He has had some problems with dizziness with standing, he had a blackout episode on 11/25/2016. He has a cardiac monitor in place. 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 The patient has had some mild drops in blood pressure from sitting to standing. He is staying well-hydrated. The patient has required some assistance with bathing and dressing, he is having difficulty telling when he needs to have a bowel movement. He is not very active during the day. The patient is having increasing problems with apraxia. He remains on low-dose aspirin.  REVIEW OF SYSTEMS: Out of a complete 14 system review of symptoms, the patient complains only of the following symptoms, and all other reviewed systems are negative.  Unexpected weight change, food allergies, cold intolerance, bruise/bleed easily, memory loss, dizziness, headache, speech  difficulty, passing out, confusion, anxious, sleep talking  ALLERGIES: Allergies  Allergen Reactions  . Ciprofloxacin Other (See Comments)    ?   thrush  . Statins Other (See Comments)    Confusion, memory loss and couldn't concentrate   . Ambien [Zolpidem Tartrate] Other (See Comments)    irritability  . Effient [Prasugrel Hcl]     " bleeds too much"  . Rosuvastatin Calcium Other (See Comments)    Confusion, memory loss and couldn't concentrate   . Zetia [Ezetimibe] Other (See Comments)    Blood sugar too high    HOME MEDICATIONS: Outpatient Medications Prior to Visit  Medication Sig Dispense Refill  . aspirin EC 81 MG tablet Take 81 mg by mouth every morning.    Marland Kitchen. atorvastatin (LIPITOR) 10 MG tablet Take 5 mg by mouth at bedtime.    . clopidogrel (PLAVIX) 75 MG tablet Take 75 mg by mouth daily.    Marland Kitchen. co-enzyme Q-10 50 MG capsule Take 200 mg by mouth every morning.     . docusate sodium (COLACE) 100 MG capsule Take 1 capsule (100 mg total) by mouth every 12 (twelve) hours. 60 capsule 0  . fexofenadine (ALLEGRA) 180 MG tablet Take 180 mg by mouth daily as needed for allergies.     . memantine (NAMENDA XR) 28 MG CP24 24 hr capsule Take 1 capsule (28 mg total) by mouth daily. Brand medically necessary 90 capsule 3  . nitroGLYCERIN (NITROSTAT) 0.4 MG SL tablet Place 0.4 mg under the tongue every 5 (five) minutes as needed for chest pain.    . Omega-3  Fatty Acids (SALMON OIL-1000) 200 MG CAPS Take 1 capsule by mouth 2 (two) times daily.     Marland Kitchen omeprazole (PRILOSEC) 20 MG capsule Take 20 mg by mouth every morning.     Marland Kitchen Phenazopyridine HCl (AZO TABS PO) Take by mouth. As needed for urgency and burning.    . vitamin B-12 (CYANOCOBALAMIN) 1000 MCG tablet Take 1,000 mcg by mouth daily.    . vitamin E 400 UNIT capsule Take 400 Units by mouth daily.    . carvedilol (COREG) 6.25 MG tablet Take 3.125 mg by mouth See admin instructions. Pt to take twice daily ONLY IF BLOOD PRESSURE IS OVER 160      . flavoxATE (URISPAS) 100 MG tablet Take 1 tablet (100 mg total) by mouth 3 (three) times daily as needed for bladder spasms. 30 tablet 0   No facility-administered medications prior to visit.     PAST MEDICAL HISTORY: Past Medical History:  Diagnosis Date  . Alzheimer's disease 11/11/2014  . Anemia   . Bilateral carpal tunnel syndrome 01/12/2016  . Colon polyp   . Coronary atherosclerosis of unspecified type of vessel, native or graft   . Disturbance of skin sensation   . Esophageal reflux   . Hypertension   . Memory difficulty 04/22/2013  . Obesity, unspecified   . Other B-complex deficiencies   . Type II or unspecified type diabetes mellitus with neurological manifestations, not stated as uncontrolled(250.60)   . Type II or unspecified type diabetes mellitus without mention of complication, not stated as uncontrolled   . Unspecified hereditary and idiopathic peripheral neuropathy     PAST SURGICAL HISTORY: Past Surgical History:  Procedure Laterality Date  . BY-PASS SURGERY  2005  . COLONOSCOPY    . COLONOSCOPY N/A 11/09/2016   Procedure: COLONOSCOPY;  Surgeon: Carman Ching, MD;  Location[facility] ENDOSCOPY;  Service: Endoscopy;  Laterality: N/A;  . CORONARY STENT PLACEMENT  2017  . IR GENERIC HISTORICAL  07/27/2016   IR ANGIO INTRA EXTRACRAN SEL COM CAROTID INNOMINATE BILAT MOD SED 07/27/2016 Julieanne Cotton, MD [facility]  . IR GENERIC HISTORICAL  07/27/2016   IR ANGIO VERTEBRAL SEL SUBCLAVIAN INNOMINATE UNI R MOD SED 07/27/2016 Julieanne Cotton, MD [facility]  . IR GENERIC HISTORICAL  07/27/2016   IR ANGIO VERTEBRAL SEL VERTEBRAL UNI L MOD SED 07/27/2016 Julieanne Cotton, MD [facility]  . MOLES REMOVAL  2013    FAMILY HISTORY: Family History  Problem Relation Age of Onset  . Hypertension Mother   . Alzheimer's disease Mother   . Dementia Mother   . Cancer Brother   . Heart attack Brother   . Kidney disease Father   . Alzheimer's disease Father   . Rheum  arthritis Sister     SOCIAL HISTORY: Social History   Socioeconomic History  . Marital status: Married    Spouse name: Not on file  . Number of children: 2  . Years of education: bachelor's  . Highest education level: Not on file  Social Needs  . Financial resource strain: Not on file  . Food insecurity - worry: Not on file  . Food insecurity - inability: Not on file  . Transportation needs - medical: Not on file  . Transportation needs - non-medical: Not on file  Occupational History  . Occupation: Retired    Associate Professor: RETIRED  Tobacco Use  . Smoking status: Former Games developer  . Smokeless tobacco: Never Used  Substance and Sexual Activity  . Alcohol use: No  . Drug  use: No  . Sexual activity: Not on file  Other Topics Concern  . Not on file  Social History Narrative   Lives at home w/ his wife and cats   Patient is left handed.   Patient rarely drinks caffeine.      PHYSICAL EXAM  Vitals:   06/17/17 1014  Height: 5\' 6"  (1.676 m)   Body mass index is 29.05 kg/m.   MMSE - Mini Mental State Exam 06/17/2017 12/13/2016 06/13/2016  Orientation to time 1 0 1  Orientation to Place 2 3 4   Registration 3 3 3   Attention/ Calculation 0 0 0  Recall 0 0 0  Language- name 2 objects 2 2 2   Language- repeat 1 1 1   Language- follow 3 step command 3 2 3   Language- read & follow direction 1 1 1   Write a sentence 0 0 0  Copy design 0 0 0  Total score 13 12 15      Generalized: Well developed, in no acute distress   Neurological examination  Mentation: Alert oriented to time, place, history taking. Follows all commands although some trouble with comprehension of directions.  Speech and language fluent Cranial nerve II-XII: Pupils were equal round reactive to light. Extraocular movements were full, visual field were full on confrontational test. Facial sensation and strength were normal. Uvula tongue midline. Head turning and shoulder shrug  were normal and symmetric. Motor:  The motor testing reveals 5 over 5 strength of all 4 extremities. Good symmetric motor tone is noted throughout.  Sensory: Sensory testing is intact to soft touch on all 4 extremities. No evidence of extinction is noted.  Coordination: Cerebellar testing reveals good finger-nose-finger and heel-to-shin bilaterally.  Gait and station: Gait is normal. Reflexes: Deep tendon reflexes are symmetric and normal bilaterally.   DIAGNOSTIC DATA (LABS, IMAGING, TESTING) - I reviewed patient records, labs, notes, testing and imaging myself where available.  Lab Results  Component Value Date   WBC 5.5 01/16/2017   HGB 11.2 (L) 01/16/2017   HCT 33.1 (L) 01/16/2017   MCV 86.6 01/16/2017   PLT 235 01/16/2017      Component Value Date/Time   NA 131 (L) 01/16/2017 1324   K 3.7 01/16/2017 1324   CL 101 01/16/2017 1324   CO2 22 01/16/2017 1324   GLUCOSE 102 (H) 01/16/2017 1324   BUN 7 01/16/2017 1324   CREATININE 0.80 01/16/2017 1324   CALCIUM 8.6 (L) 01/16/2017 1324   PROT 5.8 (L) 01/16/2017 1324   ALBUMIN 3.6 01/16/2017 1324   AST 18 01/16/2017 1324   ALT 13 (L) 01/16/2017 1324   ALKPHOS 59 01/16/2017 1324   BILITOT 1.0 01/16/2017 1324   GFRNONAA >60 01/16/2017 1324   GFRAA >60 01/16/2017 1324   Lab Results  Component Value Date   CHOL 150 12/16/2015   HDL 27 (L) 12/16/2015   LDLCALC 90 12/16/2015   TRIG 165 (H) 12/16/2015   CHOLHDL 5.6 12/16/2015   Lab Results  Component Value Date   HGBA1C 7.2 (H) 12/16/2015   Lab Results  Component Value Date   VITAMINB12 946 (H) 12/15/2015   Lab Results  Component Value Date   TSH 1.159 12/15/2015      ASSESSMENT AND PLAN 71 y.o. year old male  has a past medical history of Alzheimer's disease (11/11/2014), Anemia, Bilateral carpal tunnel syndrome (01/12/2016), Colon polyp, Coronary atherosclerosis of unspecified type of vessel, native or graft, Disturbance of skin sensation, Esophageal reflux, Hypertension, Memory difficulty (04/22/2013),  Obesity,  unspecified, Other B-complex deficiencies, Type II or unspecified type diabetes mellitus with neurological manifestations, not stated as uncontrolled(250.60), Type II or unspecified type diabetes mellitus without mention of complication, not stated as uncontrolled, and Unspecified hereditary and idiopathic peripheral neuropathy. here with :  1.  Memory disturbance  Patient's memory score has remained stable.  He will continue on Namenda.  I have advised that if his symptoms worsen or he develops new symptoms they should let us know. he will follow-up in 6 months or sooner if needed.  I spent 15 minutes with the patient. 50% of this time was spent assessing his memory score.     Butch Penny, MSN, NP-C 06/17/2017, 10:25 AM [facility] [address] [place], [place] [address] [phone]

## 2017-06-17 NOTE — Patient Instructions (Signed)
Your Plan:  Continue Namenda Memory score is stable If your symptoms worsen or you develop new symptoms please let us know.   Thank you for coming to see us at [facility]. I hope we have been able to provide you high quality care today.  You may receive a patient satisfaction survey over the next few weeks. We would appreciate your feedback and comments so that we may continue to improve ourselves and the health of our patients.  

## 2017-06-17 NOTE — Progress Notes (Signed)
I have read the note, and I agree with the clinical assessment and plan.  Charles K Willis   

## 2017-07-17 ENCOUNTER — Telehealth: Payer: Self-pay | Admitting: Adult Health

## 2017-07-17 NOTE — Telephone Encounter (Signed)
LVM for wife on DPR requesting she call back to discuss her concerns.

## 2017-07-17 NOTE — Telephone Encounter (Signed)
Pt's wife called said there is a lot that has been going on since the pt was last seen. [facility] to go over the concerns.

## 2017-07-18 NOTE — Telephone Encounter (Signed)
Noted, thank you  Dr. Anne HahnWillis- Lorain ChildesFYI

## 2017-07-18 NOTE — Telephone Encounter (Signed)
Called, LVM for wife to call back. Was going to offer appt for 07/22/17 at 12pm, check in 1130am with Dr. Anne HahnWillis. If they are agreeable, I can schedule the appt

## 2017-07-18 NOTE — Telephone Encounter (Signed)
Pt's wife returned RN's call. appt was scheduled for 07/22/17 @ 12pm.  Thank you

## 2017-07-18 NOTE — Telephone Encounter (Signed)
Pt wife (on [place]) has called RN Corrie DandyMary C back, she is asking for a returned call please

## 2017-07-18 NOTE — Telephone Encounter (Addendum)
Called wife who stated that patient is anxious, picks at things, can't form his words in the morning, doesn't always recognize his home. [facility] due to possible constipation. She stated a CT scan of abdomen, pelvis was normal. She reported that he cries a lot, his eating is sloppier, and he doesn't always take his meds or drink water to take meds.  She stated he doesn't understand what she's telling him. He did have a Urolift for BPH in Dec, did well but had two ED visits afterwards for blood clots and other c/o. She stated he is much worse, and she is concerned about his dementia being vascular, asking if his blockages could be worse. This RN informed her Dr Anne HahnWillis is out of the office but will [facility]. Advised she will get a call back later today.  Wife verbalized understanding, appreciation.

## 2017-07-22 ENCOUNTER — Ambulatory Visit (INDEPENDENT_AMBULATORY_CARE_PROVIDER_SITE_OTHER): Payer: Medicare Other | Admitting: Neurology

## 2017-07-22 ENCOUNTER — Encounter: Payer: Self-pay | Admitting: Neurology

## 2017-07-22 VITALS — BP 136/86 | HR 69 | Ht 66.0 in | Wt 169.0 lb

## 2017-07-22 DIAGNOSIS — G309 Alzheimer's disease, unspecified: Secondary | ICD-10-CM | POA: Diagnosis not present

## 2017-07-22 DIAGNOSIS — F0280 Dementia in other diseases classified elsewhere without behavioral disturbance: Secondary | ICD-10-CM | POA: Diagnosis not present

## 2017-07-22 MED ORDER — ESCITALOPRAM OXALATE 5 MG PO TABS: 5.0000 mg | ORAL_TABLET | Freq: Every day | ORAL | 2 refills | Status: DC

## 2017-07-22 NOTE — Patient Instructions (Signed)
   We will start Lexapro 5 mg a day, call for dose adjustments.

## 2017-07-22 NOTE — Progress Notes (Signed)
Reason for visit: Alzheimer's disease  Gregory RuddyVirgil Ortega is an 72 y.o. male  History of present illness:  [name] is a 72 year old left-handed white male with a history of Alzheimer's disease with a progressive dementing illness.  The patient was last seen in December 2018, he returns today on an urgent basis because of difficulty with anxiety.  The wife has noted increasing problems with apraxia, he has separation anxiety, he bites his fingernails constantly.  He sometimes will mention that other people are in the house, he may talk to himself.  He may have episodes of crying.  The patient is having some difficulty using a fork or knife or spoon properly.  He returns to this office for an evaluation.  Past Medical History:  Diagnosis Date  . Alzheimer's disease 11/11/2014  . Anemia   . Bilateral carpal tunnel syndrome 01/12/2016  . Colon polyp   . Coronary atherosclerosis of unspecified type of vessel, native or graft   . Disturbance of skin sensation   . Esophageal reflux   . Hypertension   . Memory difficulty 04/22/2013  . Obesity, unspecified   . Other B-complex deficiencies   . Type II or unspecified type diabetes mellitus with neurological manifestations, not stated as uncontrolled(250.60)   . Type II or unspecified type diabetes mellitus without mention of complication, not stated as uncontrolled   . Unspecified hereditary and idiopathic peripheral neuropathy     Past Surgical History:  Procedure Laterality Date  . BY-PASS SURGERY  2005  . COLONOSCOPY    . COLONOSCOPY N/A 11/09/2016   Procedure: COLONOSCOPY;  Surgeon: Carman ChingJames Edwards, MD;  [facility];  Service: Endoscopy;  Laterality: N/A;  . CORONARY STENT PLACEMENT  2017  . IR GENERIC HISTORICAL  07/27/2016   IR ANGIO INTRA EXTRACRAN SEL COM CAROTID INNOMINATE BILAT MOD SED 07/27/2016 Julieanne CottonSanjeev Deveshwar, MD [facility]  . IR GENERIC HISTORICAL  07/27/2016   IR ANGIO VERTEBRAL SEL SUBCLAVIAN INNOMINATE UNI R MOD  SED 07/27/2016 Julieanne CottonSanjeev Deveshwar, MD [facility]  . IR GENERIC HISTORICAL  07/27/2016   IR ANGIO VERTEBRAL SEL VERTEBRAL UNI L MOD SED 07/27/2016 Julieanne CottonSanjeev Deveshwar, MD [facility]  . MOLES REMOVAL  2013    Family History  Problem Relation Age of Onset  . Hypertension Mother   . Alzheimer's disease Mother   . Dementia Mother   . Cancer Brother   . Heart attack Brother   . Kidney disease Father   . Alzheimer's disease Father   . Rheum arthritis Sister     Social history:  reports that he has quit smoking. he has never used smokeless tobacco. He reports that he does not drink alcohol or use drugs.    Allergies  Allergen Reactions  . Ciprofloxacin Other (See Comments)    ?   thrush  . Statins Other (See Comments)    Confusion, memory loss and couldn't concentrate   . Ambien [Zolpidem Tartrate] Other (See Comments)    irritability  . Brilinta [Ticagrelor]   . Effient [Prasugrel Hcl]     " bleeds too much"  . Rosuvastatin Calcium Other (See Comments)    Confusion, memory loss and couldn't concentrate   . Seroquel [Quetiapine Fumarate] Nausea Only    irritability  . Zetia [Ezetimibe] Other (See Comments)    Blood sugar too high    Medications:  Prior to Admission medications   Medication Sig Start Date End Date Taking? Authorizing Provider  aspirin EC 81 MG tablet Take 81 mg  by mouth every morning.   Yes [provider]  atorvastatin (LIPITOR) 10 MG tablet Take 5 mg by mouth at bedtime.   Yes [provider]  clopidogrel (PLAVIX) 75 MG tablet Take 75 mg by mouth daily.   Yes [provider]  co-enzyme Q-10 50 MG capsule Take 200 mg by mouth every morning.    Yes [provider]  docusate sodium (COLACE) 100 MG capsule Take 1 capsule (100 mg total) by mouth every 12 (twelve) hours. 01/16/17  Yes Long, Arlyss Repress, MD  fexofenadine (ALLEGRA) 180 MG tablet Take 180 mg by mouth daily as needed for allergies.    Yes [provider]    memantine (NAMENDA XR) 28 MG CP24 24 hr capsule Take 1 capsule (28 mg total) by mouth daily. Brand medically necessary 12/13/16  Yes York Spaniel, MD  nitroGLYCERIN (NITROSTAT) 0.4 MG SL tablet Place 0.4 mg under the tongue every 5 (five) minutes as needed for chest pain.   Yes [provider]  Omega-3 Fatty Acids (SALMON OIL-1000) 200 MG CAPS Take 1 capsule by mouth 2 (two) times daily.    Yes [provider]  omeprazole (PRILOSEC) 20 MG capsule Take 20 mg by mouth every morning.    Yes [provider]  Phenazopyridine HCl (AZO TABS PO) Take by mouth. As needed for urgency and burning.   Yes [provider]  vitamin B-12 (CYANOCOBALAMIN) 1000 MCG tablet Take 1,000 mcg by mouth daily.   Yes [provider]  vitamin E 400 UNIT capsule Take 400 Units by mouth daily.   Yes [provider]    ROS:  Out of a complete 14 system review of symptoms, the patient complains only of the following symptoms, and all other reviewed systems are negative.  Fatigue, weight loss Heart murmur Cold intolerance Food allergies Bruising easily Dizziness, speech difficulty, weakness Anxiety  Blood pressure 136/86, pulse 69, height 5\' 6"  (1.676 m), weight 169 lb (76.7 kg).  Physical Exam  General: The patient is alert and cooperative at the time of the examination.  Skin: No significant peripheral edema is noted.   Neurologic Exam  Mental status: The patient is alert and oriented x 2 at the time of the examination (not oriented to date).   Cranial nerves: Facial symmetry is present. Speech is normal, no aphasia or dysarthria is noted. Extraocular movements are full. Visual fields are full.  Motor: The patient has good strength in all 4 extremities.  Sensory examination: Soft touch sensation is symmetric on the face, arms, and legs.  Coordination: The patient has good finger-nose-finger bilaterally.  The patient has significant apraxia with  the use of the legs with heel to shin testing.  Gait and station: The patient has a normal gait. Romberg is negative. No drift is seen.  Reflexes: Deep tendon reflexes are symmetric.   Assessment/Plan:  1.  Progressive dementia, Alzheimer's disease  2.  Anxiety  The patient is having increased problems with anxiety which is becoming problematic at home.  The patient will be started on Lexapro in low-dose, they will call for any dose adjustments.  The patient is having increasing problems with word finding and with apraxia.  The Namenda will be continued, he will follow-up for his next scheduled visit.  Marlan Palau MD 07/22/2017 12:42 PM  [facility] [address] [place], [place] [address]  Phone [phone] Fax [phone]

## 2017-07-24 ENCOUNTER — Other Ambulatory Visit (HOSPITAL_COMMUNITY): Payer: Self-pay | Admitting: Interventional Radiology

## 2017-08-12 ENCOUNTER — Telehealth: Payer: Self-pay | Admitting: Neurology

## 2017-08-12 MED ORDER — BUPROPION HCL ER (SR) 100 MG PO TB12: 100.0000 mg | ORAL_TABLET | Freq: Every day | ORAL | 1 refills | Status: DC

## 2017-08-12 NOTE — Telephone Encounter (Signed)
I called the wife.  The patient took one half of a 5 mg Lexapro tablet and had diffuse muscular aching.  He only took 1 pill.  He is having ongoing problems with some agitation, we will try Wellbutrin.  If this does not work, hydroxyzine or even Ativan may be tried in the future.

## 2017-08-12 NOTE — Telephone Encounter (Signed)
Pts wife calling(on DPR) stating that about once a week the pt goes into a mood where all he wants to do is argue and fuss. Wife said it went on until about 3am last night and got so bad that she locked herself in the bedroom. Pts Wife also said that escitalopram (LEXAPRO) 5 MG tablet is a goo choice of medication for the pt and would really like a call back to discuss more

## 2017-08-16 ENCOUNTER — Telehealth: Payer: Self-pay | Admitting: *Deleted

## 2017-08-16 NOTE — Telephone Encounter (Signed)
Faxed back signed Hospice referral [facility] back [facility] (phone: [phone], Fax: [phone]). Received fax confirmation.

## 2017-08-21 NOTE — Telephone Encounter (Signed)
Denise/Hospice [phone] called said she was able to admit the pt to Hospice at home. She needs a VO for DNR which is what the pt has requested in the living will. Please call to advise.

## 2017-08-21 NOTE — Telephone Encounter (Signed)
I called the nurse, Angelique Blonderenise.  I gave verbal order for DNR status for the patient.  The patient will be entering into hospice.

## 2017-08-23 NOTE — Telephone Encounter (Signed)
[facility], Dr. Johnnette GourdWilli reviewed orders and signed, [facility]. Received a receipt of confirmation.

## 2017-08-27 ENCOUNTER — Telehealth (HOSPITAL_COMMUNITY): Payer: Self-pay

## 2017-08-27 NOTE — Telephone Encounter (Signed)
I called the wife.  The patient is under hospice care for his Alzheimer's, I would not pursue any further evaluation through Dr. Corliss Skainseveshwar.

## 2017-08-27 NOTE — Telephone Encounter (Signed)
Called to schedule f/u mri. Pt's wife will talk to hospice and see if they want to schedule f/u and call back. AW

## 2017-08-27 NOTE — Telephone Encounter (Signed)
Pts wife calling stating that Gregory Ortega called to set up the yearly MRI and MRA. Dewayne Hatchnn is wanting to know if this is worth the trouble to get them since pt is already under hospices care.

## 2017-08-28 IMAGING — XA IR VERTEBRAL  SELECTIVE  UNILAT LEFT (MS)
1 series · 1 of 1 positions shown · IV contrast (IODINE)
Comparison: none

CLINICAL DATA: Transient ischemic attack. worsening memory. History
of vertebrobasilar ischemia per

[Series 3: carotid · 1 of 1 slices shown]
[im 1/1]
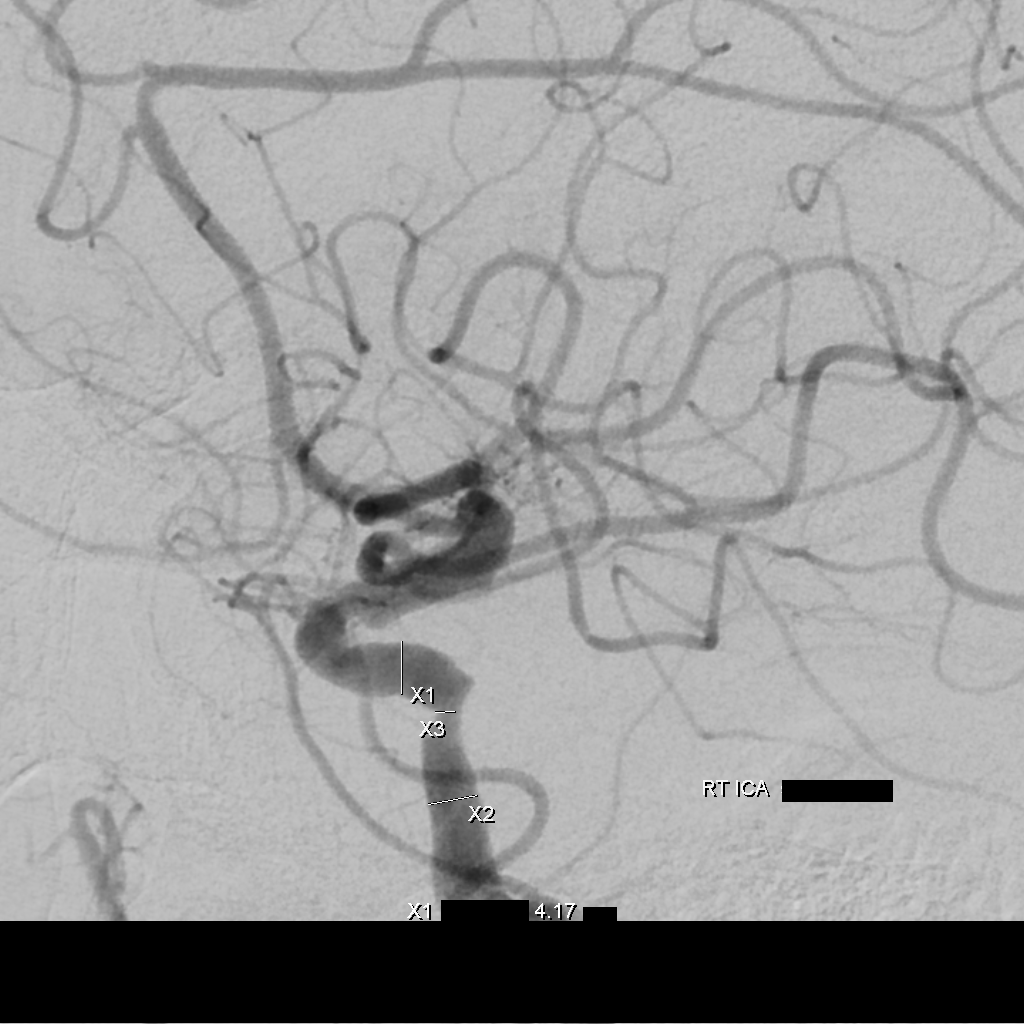

[1 of 1 positions shown; findings below may reference images not displayed]

EXAM:
BILATERAL COMMON CAROTID AND INNOMINATE ANGIOGRAPHY AND BILATERAL
VERTEBRAL ARTERY ANGIOGRAMS

PROCEDURE:
Contrast: Isovue 300 60 mL

Anesthesia/Sedation:  Conscious sedation.

Medications: Versed at the 0.5 mg IV. Fentanyl 25 mcg IV. Heparin
9444 units IV.

Following a full explanation of the procedure along with the
potential associated complications, an informed witnessed consent
was obtained.

The right groin was prepped and draped in the usual sterile fashion.
Thereafter using modified Seldinger technique, transfemoral access
into the right common femoral artery was obtained without
difficulty. Over a 0.035 inch guidewire, a 5 French Pinnacle sheath
was inserted. Through this, and also over 0.035 inch guidewire, a 5
French JB 1 catheter was advanced to the aortic arch region and
selectively positioned in the right common carotid artery, the right
subclavian artery, the left common carotid artery and the left
vertebral artery.

There were no acute complications. The patient tolerated the
procedure well.
FINDINGS: The right common carotid arteriogram demonstrates right external
carotid artery and its major branches to be widely patent

The right internal carotid artery at the bulb has a circumferential
plaque resulting in less than 20% stenosis by NASCET criteria

No acute ulcerations seen. The vessel seen to opacify normally to
the brain skullbase.

The the petrous segment is normal

There is approximately 70-75% stenosis of the petrous cavernous
junction.

Distal to this there is approximately 50% to 60% stenosis of the
right internal carotid artery distal cavernous segment

The supraclinoid segment is widely patent

The right middle cerebral artery demonstrates an approximately 50%
stenosis though at its origin

Mild stenosis in the continues to be present in the right middle
cerebral artery distal to this

More distally the right middle cerebral artery and right anterior
cerebral artery distributions opacify into the capillary and venous
phases

At the left common carotid arteriogram demonstrates the left
external carotid carotid artery and its major branches to be widely
patent

The left internal carotid artery at the bulb has approximately try
20% stenosis by NASCET criteria. No acute ulcerations are seen.

The vessel is otherwise seen to opacify normally to the brain
skullbase

I there is mild stenosis at the cervical petrous junction

The petrous segment is otherwise widely patent at as a cavernous
segment

There is approximately 50% stenosis of the left internal carotid
supraclinoid segment just proximal to the origin of the left
posterior communicating artery which demonstrates a prominent
stenoses at its origin

The left middle cerebral artery and left anterior cerebral artery
opacify normally into the capillary and venous phases

The left vertebral artery origin demonstrates mild stenosis at its
origin.

Also demonstrates a prior approximately 50% stenosis of the left
vertebral artery at the level of the C5-C6

No distally the vessel is seen to opacify normally to the brain
skullbase

Wide patency seen of the left vertebral junction and the posterior
inferior cerebellar artery

The mild focal areas stenosis just distal to the origin of the left
posterior-inferior cerebellar artery

Distal to this the basilar artery, the superior cerebellar arteries
and the anterior- inferior cerebellar is are seen to opacify into
the capillary and venous phases

The right posterior cerebral artery denied demonstrates a severe
segmental stenosis at the P1 P2 junction. Compared to the previous
arteriogram of the 12/29/2015, this is progressed a significantly

An apparent origin of the are right subclavian artery is noted. A
hypoplastic right vertebral artery origin demonstrates a high-grade
stenoses. However contrast is seen to ascend to the cranial skull
base to the right posterior-inferior cerebellar artery
IMPRESSION: Interval progression of a stenoses angiographically of the right
right petrous cavernous junction to 77 5%, and possibly the of the
supraclinoid right ICA to 50-60%.

Interval progression of so the severe stenoses involving the right
posterior cerebral artery P1 P2 junction.

Approximately 50% stenosis of the left internal carotid artery
supraclinoid segment. High-grade stenosis of the right posterior
communicating artery at its origin.

Approximately 50% stenosis of the right middle cerebral artery at
its origin.

The angiographic findings were reviewed with the patient so wife.

## 2017-08-30 ENCOUNTER — Telehealth (HOSPITAL_COMMUNITY): Payer: Self-pay | Admitting: Radiology

## 2017-08-30 NOTE — Telephone Encounter (Signed)
Returned call to pt's wife. She stated that [name] is under home hospice care at this time. They do not wish to proceed with his yearly follow-up MRI/MRA. I advised the pt's wife that should he develop sx or they change their mind to simply call us back. She agrees with this plan. JM

## 2017-09-16 IMAGING — CT CT VIRTUAL COLONOSCOPY SCREENING
3 of 7 series · 12 of 36 positions shown, 18 images · non-contrast
Comparison: None.

CLINICAL DATA: Anemia

EXAM:
CT VIRTUAL COLONOSCOPY SCREENING
TECHNIQUE: The patient was given a standard bowel preparation with Gastrografin
and barium for fluid and stool tagging respectively. The quality of
the bowel preparation is moderate. Automated CO2 insufflation of the
colon was performed prior to image acquisition and colonic
distention is moderate. Image post processing was used to generate a
3D endoluminal fly-through projection of the colon and to
electronically subtract stool/fluid as appropriate.

[Series 6: prone (id) · axial · 0.80mm/px · z∈[-449,-13]mm · 8 of 449 slices shown, 13 images (1 of 2)]
[im 50/449  soft-tissue]
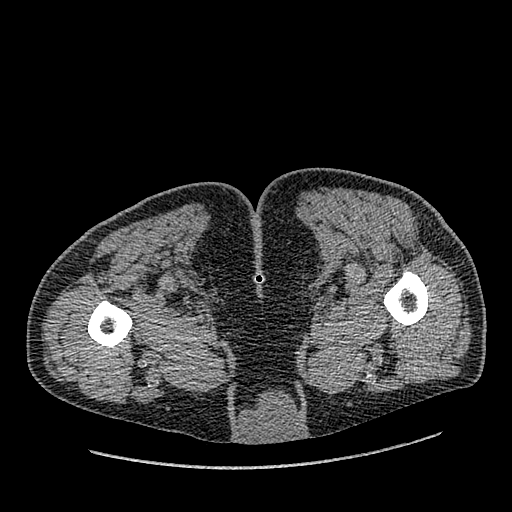
[im 50/449  bone]
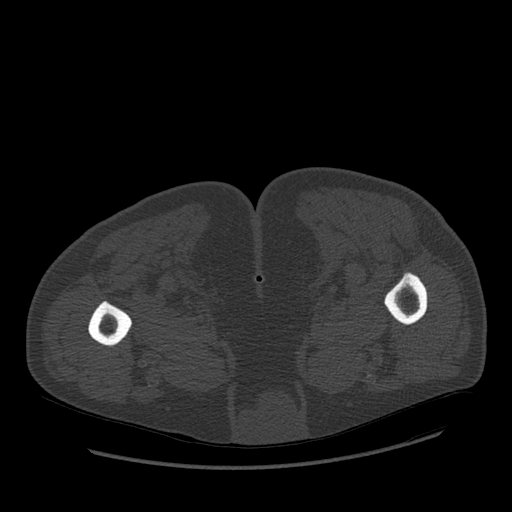
[im 100/449  soft-tissue]
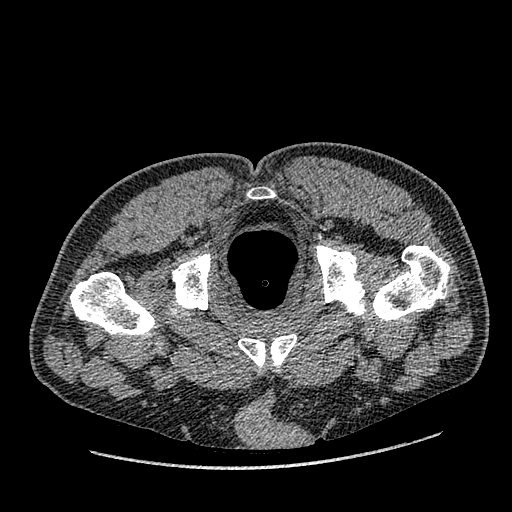
[im 150/449  soft-tissue]
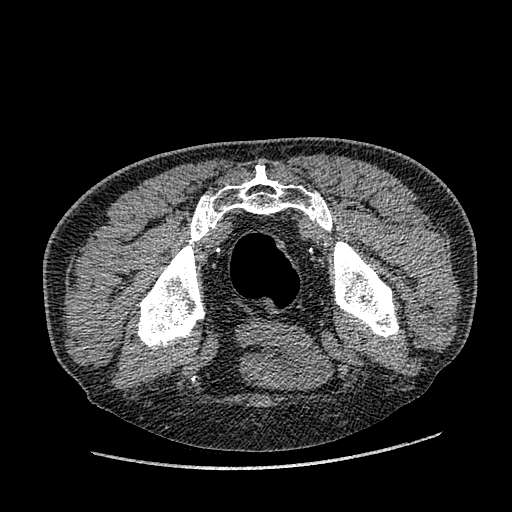
[im 200/449  soft-tissue]
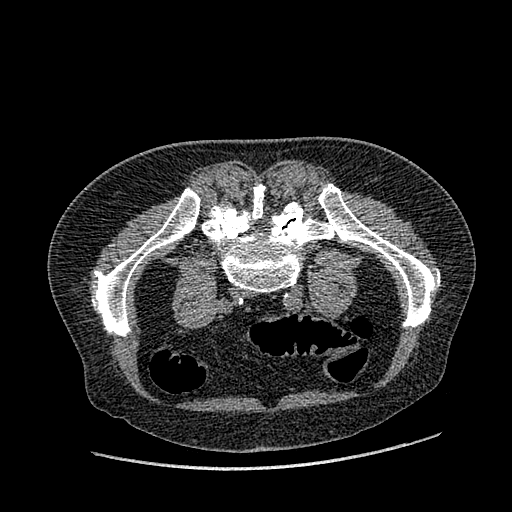
[im 249/449  soft-tissue]
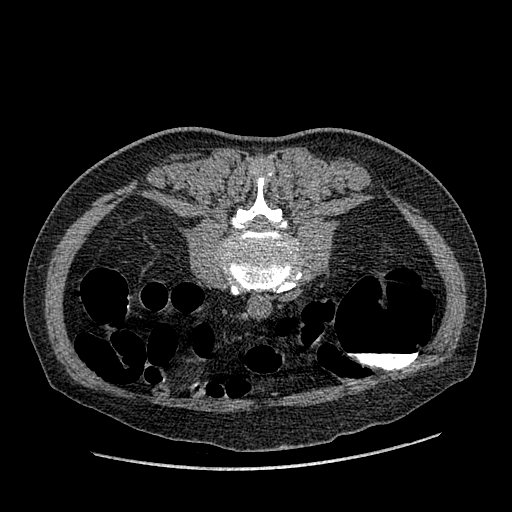
[im 249/449  lung]
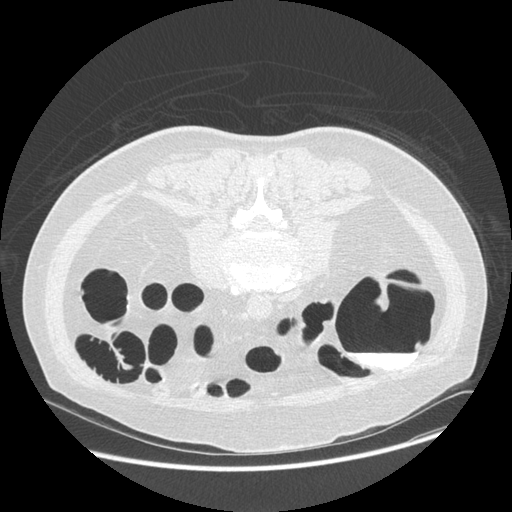
[im 299/449  soft-tissue]
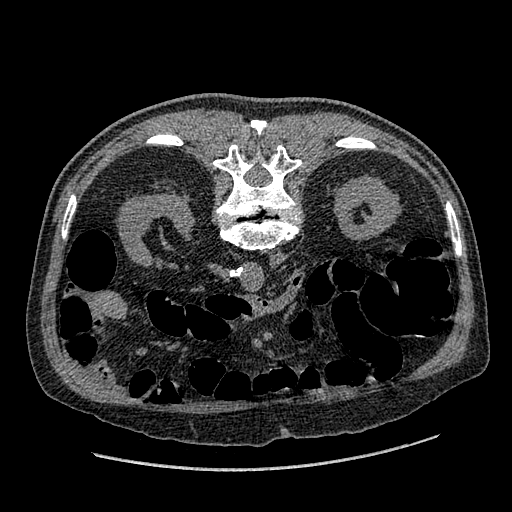
[im 299/449  lung]
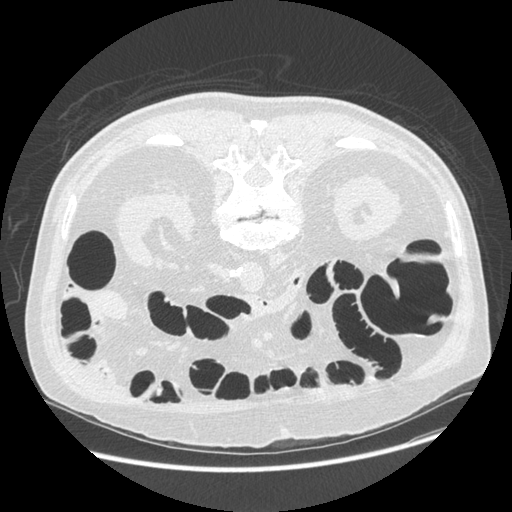
[im 349/449  soft-tissue]
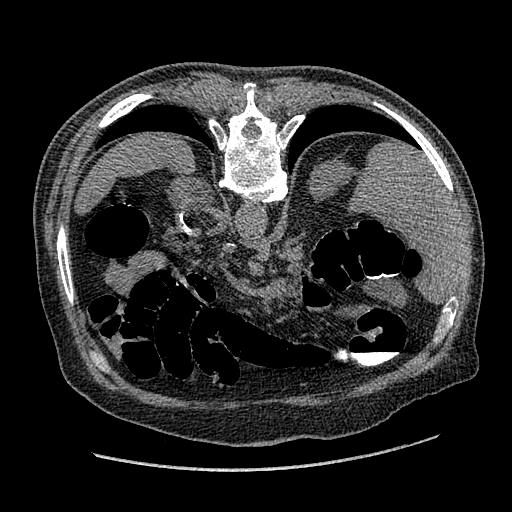
[im 349/449  lung]
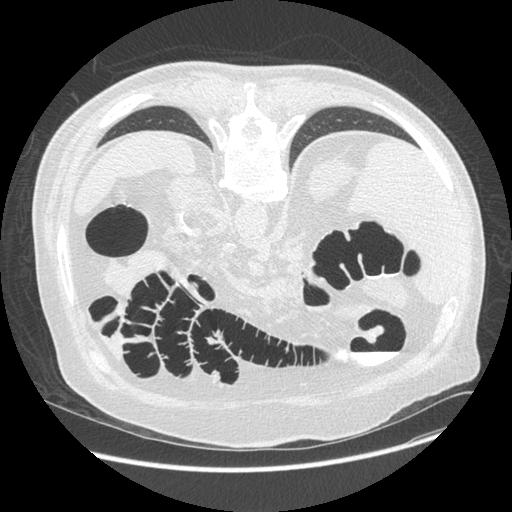
[im 399/449  soft-tissue]
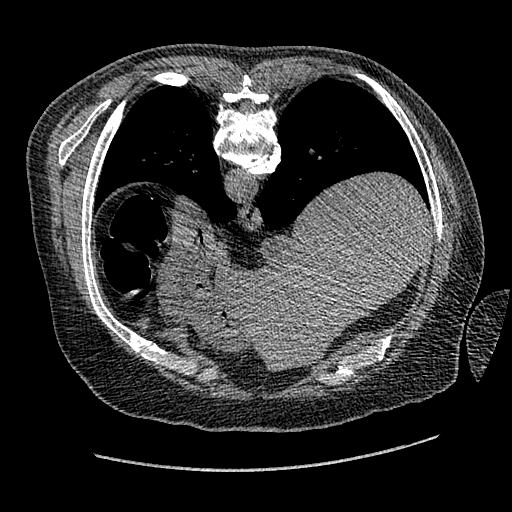
[im 399/449  lung]
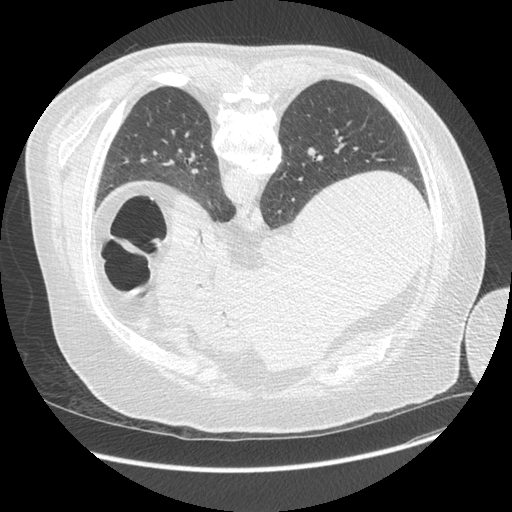

[Series 7: prone (id) · axial · 0.80mm/px · z∈[-370,-90]mm · 3 of 225 slices shown (2 of 2)]
[im 57/225  soft-tissue]
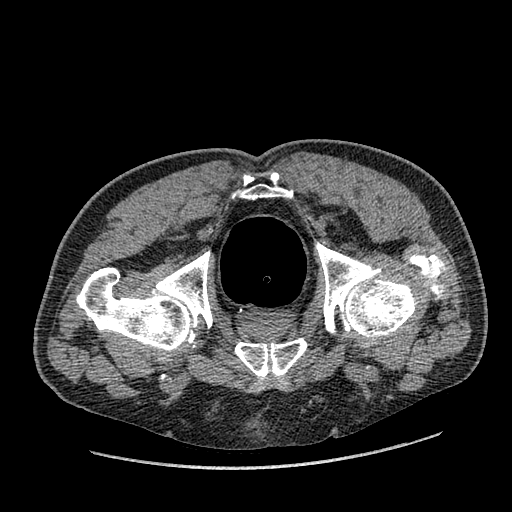
[im 113/225  soft-tissue]
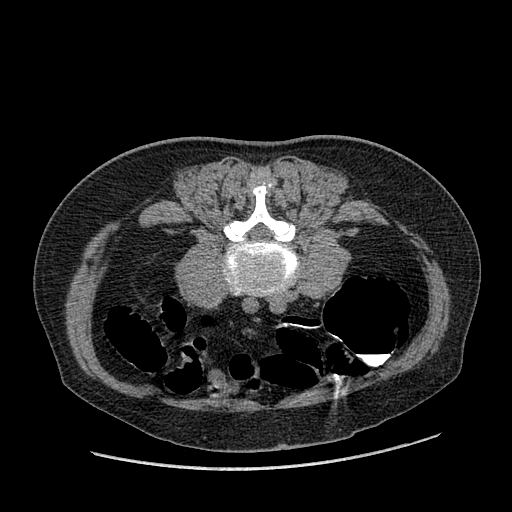
[im 169/225  soft-tissue]
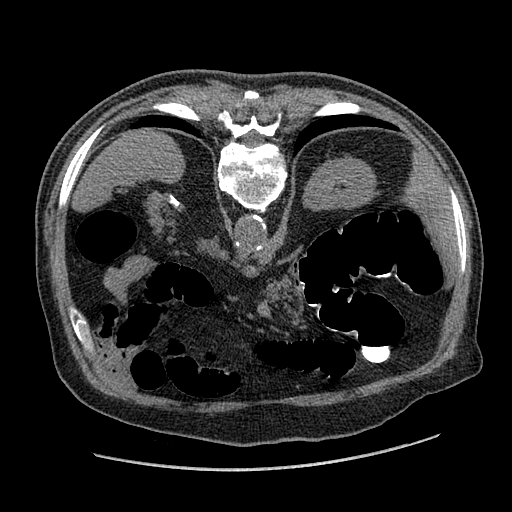

[Series 601: coronal body · coronal · 1.04mm/px · 1 of 122 slices shown, 2 images]
[im 41/122  soft-tissue]
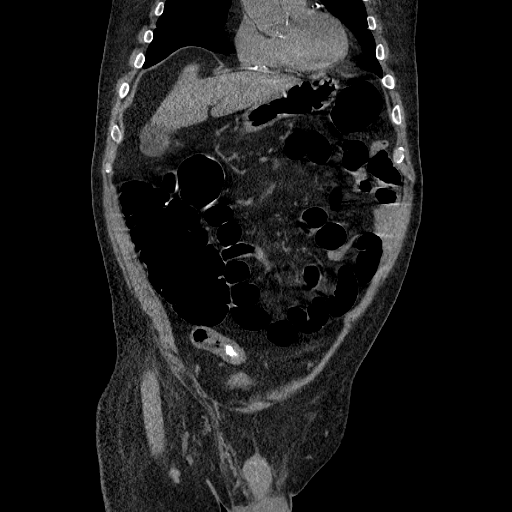
[im 41/122  bone]
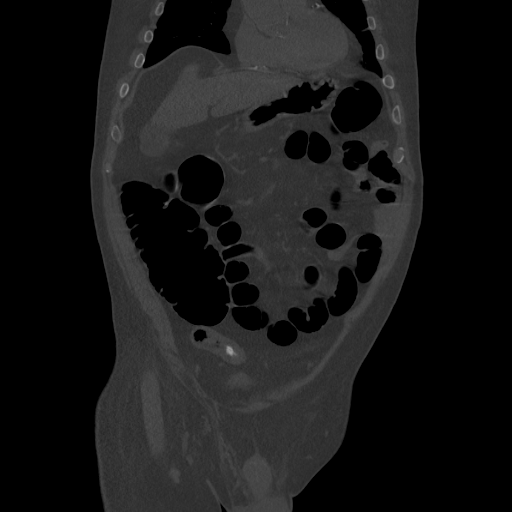

[12 of 36 positions shown; findings below may reference images not displayed]

FINDINGS: VIRTUAL COLONOSCOPY

There is a 7 mm polypoid filling defects noted along the anterior
wall of the descending colon 60 cm from the rectum which persists on
supine and prone imaging concerning for small polyp. No other non
barium tagged persistent polypoid filling defects noted. No annular
constricting lesions.

Virtual colonoscopy is not designed to detect diminutive polyps
(i.e., less than or equal to 5 mm), the presence or absence of which
may not affect clinical management.

CT ABDOMEN AND PELVIS WITHOUT CONTRAST

Lower chest: Lung bases are clear. No effusions. Heart is normal
size. Coronary artery and valvular calcifications. Prior CABG.

Hepatobiliary: Layering small stones in the gallbladder. No focal
hepatic abnormality.

Pancreas: Fatty replacement. No focal abnormality or ductal
dilatation.

Spleen: No focal abnormality.  Normal size.

Adrenals/Urinary Tract: No adrenal abnormality. No focal renal
abnormality. No stones or hydronephrosis. Urinary bladder is
unremarkable.

Stomach/Bowel: Stomach and small bowel unremarkable. Appendix is
visualized and normal.

Vascular/Lymphatic: Aortic and iliac calcifications. No aneurysm. No
adenopathy.

Reproductive: No visible focal abnormality.

Other: No free fluid or free air.

Musculoskeletal: No acute bony abnormality.
IMPRESSION: 7 mm polypoid filling defect in the descending colon 60 cm from the
rectum concerning for small bowel.

No acute or significant extra colonic findings in the abdomen or
pelvis

## 2017-09-17 ENCOUNTER — Other Ambulatory Visit: Payer: Self-pay | Admitting: *Deleted

## 2017-09-26 ENCOUNTER — Telehealth: Payer: Self-pay | Admitting: Neurology

## 2017-09-26 MED ORDER — BUPROPION HCL ER (SR) 150 MG PO TB12: 150.0000 mg | ORAL_TABLET | Freq: Every day | ORAL | 3 refills | Status: DC

## 2017-09-26 NOTE — Telephone Encounter (Signed)
The nurse called back concerning the ABH gel.  This stands for Ativan, Benadryl, and Haldol.  Apparently the patient gets agitated, cannot take anything by mouth, she recommended this gel to try to help calm him down.  We will use 1 g every 6 hours if needed, I will fax in a prescription to the nurse, the fax number is [phone].

## 2017-09-26 NOTE — Telephone Encounter (Signed)
I called and left a message.  To increase the Wellbutrin to 150 mg tablet.  I do not know what ABH gel is, they are to contact me about this.

## 2017-09-26 NOTE — Telephone Encounter (Signed)
Denise with Hospice calling to see if Dr. Anne HahnWillis would be willing to up the dosing for buPROPion [facility](WELLBUTRIN SR) 100 MG 12 hr tablet stating the pt had been doing well while on it but recently has been wanting to go home and having little fits(of frustration). Also wife is worried about giving him tablet so Angelique BlonderDenise is wanting to know if pt could use ABH gel to help the PRN out. Please contact at [phone]

## 2017-11-21 ENCOUNTER — Telehealth: Payer: Self-pay | Admitting: *Deleted

## 2017-11-21 NOTE — Telephone Encounter (Signed)
Faxed signed order back to [facility] [place]) re: telephone call/tuck in calls [phone]. Volunteer: Lahoma Rocker. Effective dates: 11/19/17-02/16/18. Fax: [phone]. Received fax confirmation.

## 2017-12-17 ENCOUNTER — Encounter: Payer: Self-pay | Admitting: Adult Health

## 2017-12-17 ENCOUNTER — Ambulatory Visit (INDEPENDENT_AMBULATORY_CARE_PROVIDER_SITE_OTHER): Admitting: Adult Health

## 2017-12-17 VITALS — BP 127/75 | HR 63 | Ht 66.0 in | Wt 166.8 lb

## 2017-12-17 DIAGNOSIS — F0280 Dementia in other diseases classified elsewhere without behavioral disturbance: Secondary | ICD-10-CM | POA: Diagnosis not present

## 2017-12-17 DIAGNOSIS — G309 Alzheimer's disease, unspecified: Secondary | ICD-10-CM | POA: Diagnosis not present

## 2017-12-17 NOTE — Progress Notes (Signed)
I have read the note, and I agree with the clinical assessment and plan.  Rober Skeels K Mollye Guinta   

## 2017-12-17 NOTE — Patient Instructions (Signed)
Your Plan:  Continue using Namenda, Wellbutrin and ABH cream If your symptoms worsen or you develop new symptoms please let us know.   Thank you for [facility] at [facility]. I hope we have been able to provide you high quality care today.  You may receive a patient satisfaction survey over the next few weeks. We would appreciate your feedback and comments so that we may continue to improve ourselves and the health of our patients.

## 2017-12-17 NOTE — Progress Notes (Signed)
PATIENT: Gregory Ortega DOB: 1945-10-16  REASON FOR VISIT: follow up HISTORY FROM: patient  HISTORY OF PRESENT ILLNESS: Today 12/17/17 Gregory Ortega is a 72 year old male with a history of Alzheimer's disease with a progressive dementing illness.  He returns today for follow-up.  He is currently on Namenda and tolerating it well.  He lives at home with his wife.  He feels that his memory has gotten a little worse.  He has an aide that comes in 3 times a week to help him with ADLs.  His wife also assist him.  He does not operate a motor vehicle.  Denies any trouble sleeping.  Reports that he does take melatonin every night.  His wife reports that he does have episodes of agitation but they are using ABH cream and that has helped tremendously.  He remains on Wellbutrin.  Patient returns today for an evaluation.  HISTORY Gregory Ortega is a 72 year old left-handed white male with a history of Alzheimer's disease with a progressive dementing illness.  The patient was last seen in December 2018, he returns today on an urgent basis because of difficulty with anxiety.  The wife has noted increasing problems with apraxia, he has separation anxiety, he bites his fingernails constantly.  He sometimes will mention that other people are in the house, he may talk to himself.  He may have episodes of crying.  The patient is having some difficulty using a fork or knife or spoon properly.  He returns to this office for an evaluation.    REVIEW OF SYSTEMS: Out of a complete 14 system review of symptoms, the patient complains only of the following symptoms, and all other reviewed systems are negative.   Cold intolerance, food allergies, bruise/bleed easily, memory loss, speech difficulty, agitation, confusion, sleep talking, diarrhea  ALLERGIES: Allergies  Allergen Reactions  . Ciprofloxacin Other (See Comments)    ?   thrush  . Statins Other (See Comments)    Confusion, memory loss and couldn't concentrate    . Ambien [Zolpidem Tartrate] Other (See Comments)    irritability  . Brilinta [Ticagrelor]   . Effient [Prasugrel Hcl]     " bleeds too much"  . Lexapro [Escitalopram Oxalate]     Muscle pain  . Rosuvastatin Calcium Other (See Comments)    Confusion, memory loss and couldn't concentrate   . Seroquel [Quetiapine Fumarate] Nausea Only    irritability  . Zetia [Ezetimibe] Other (See Comments)    Blood sugar too high  . Zithromax [Azithromycin] Diarrhea    HOME MEDICATIONS: Outpatient Medications Prior to Visit  Medication Sig Dispense Refill  . aspirin EC 81 MG tablet Take 81 mg by mouth every morning.    Marland Kitchen atorvastatin (LIPITOR) 10 MG tablet Take 5 mg by mouth at bedtime.    Marland Kitchen buPROPion (WELLBUTRIN SR) 150 MG 12 hr tablet Take 1 tablet (150 mg total) by mouth daily. 30 tablet 3  . clopidogrel (PLAVIX) 75 MG tablet Take 75 mg by mouth daily.    Marland Kitchen co-enzyme Q-10 50 MG capsule Take 200 mg by mouth every morning.     . docusate sodium (COLACE) 100 MG capsule Take 1 capsule (100 mg total) by mouth every 12 (twelve) hours. 60 capsule 0  . fexofenadine (ALLEGRA) 180 MG tablet Take 180 mg by mouth daily as needed for allergies.     . Melatonin 5 MG CAPS Take 1 capsule by mouth at bedtime.  2  . memantine (NAMENDA XR) 28 MG CP24  24 hr capsule Take 1 capsule (28 mg total) by mouth daily. Brand medically necessary 90 capsule 3  . metroNIDAZOLE (FLAGYL) 250 MG tablet TAKE ONE TABLET BY MOUTH EVERY 8 HOURS for 7 days  0  . nitroGLYCERIN (NITROSTAT) 0.4 MG SL tablet Place 0.4 mg under the tongue every 5 (five) minutes as needed for chest pain.    . Omega-3 Fatty Acids (SALMON OIL-1000) 200 MG CAPS Take 1 capsule by mouth 2 (two) times daily.     Marland Kitchen omeprazole (PRILOSEC) 20 MG capsule Take 20 mg by mouth every morning.     Marland Kitchen Phenazopyridine HCl (AZO TABS PO) Take by mouth. As needed for urgency and burning.    . vitamin B-12 (CYANOCOBALAMIN) 1000 MCG tablet Take 1,000 mcg by mouth daily.    .  vitamin E 400 UNIT capsule Take 400 Units by mouth daily.     No facility-administered medications prior to visit.     PAST MEDICAL HISTORY: Past Medical History:  Diagnosis Date  . Alzheimer's disease 11/11/2014  . Anemia   . Bilateral carpal tunnel syndrome 01/12/2016  . Colon polyp   . Coronary atherosclerosis of unspecified type of vessel, native or graft   . Disturbance of skin sensation   . Esophageal reflux   . Hypertension   . Memory difficulty 04/22/2013  . Obesity, unspecified   . Other B-complex deficiencies   . Type II or unspecified type diabetes mellitus with neurological manifestations, not stated as uncontrolled(250.60)   . Type II or unspecified type diabetes mellitus without mention of complication, not stated as uncontrolled   . Unspecified hereditary and idiopathic peripheral neuropathy     PAST SURGICAL HISTORY: Past Surgical History:  Procedure Laterality Date  . BY-PASS SURGERY  2005  . COLONOSCOPY    . COLONOSCOPY N/A 11/09/2016   Procedure: COLONOSCOPY;  Surgeon: Carman Ching, MD;  Location: [facility] ENDOSCOPY;  Service: Endoscopy;  Laterality: N/A;  . CORONARY STENT PLACEMENT  2017  . IR GENERIC HISTORICAL  07/27/2016   IR ANGIO INTRA EXTRACRAN SEL COM CAROTID INNOMINATE BILAT MOD SED 07/27/2016 Julieanne Cotton, MD [facility]INTER[facility]  . IR GENERIC HISTORICAL  07/27/2016   IR ANGIO VERTEBRAL SEL SUBCLAVIAN INNOMINATE UNI R MOD SED 07/27/2016 Julieanne Cotton, MD [facility]  . IR GENERIC HISTORICAL  07/27/2016   IR ANGIO VERTEBRAL SEL VERTEBRAL UNI L MOD SED 07/27/2016 Julieanne Cotton, MD [facility]  . MOLES REMOVAL  2013    FAMILY HISTORY: Family History  Problem Relation Age of Onset  . Hypertension Mother   . Alzheimer's disease Mother   . Dementia Mother   . Cancer Brother   . Heart attack Brother   . Kidney disease Father   . Alzheimer's disease Father   . Rheum arthritis Sister     SOCIAL HISTORY: Social History   Socioeconomic History  .  Marital status: Married    Spouse name: Not on file  . Number of children: 2  . Years of education: bachelor's  . Highest education level: Not on file  Occupational History  . Occupation: Retired    Associate Professor: RETIRED  Social Needs  . Financial resource strain: Not on file  . Food insecurity:    Worry: Not on file    Inability: Not on file  . Transportation needs:    Medical: Not on file    Non-medical: Not on file  Tobacco Use  . Smoking status: Former Games developer  . Smokeless tobacco: Never Used  Substance and Sexual  Activity  . Alcohol use: No  . Drug use: No  . Sexual activity: Not on file  Lifestyle  . Physical activity:    Days per week: Not on file    Minutes per session: Not on file  . Stress: Not on file  Relationships  . Social connections:    Talks on phone: Not on file    Gets together: Not on file    Attends religious service: Not on file    Active member of club or organization: Not on file    Attends meetings of clubs or organizations: Not on file    Relationship status: Not on file  . Intimate partner violence:    Fear of current or ex partner: Not on file    Emotionally abused: Not on file    Physically abused: Not on file    Forced sexual activity: Not on file  Other Topics Concern  . Not on file  Social History Narrative   Lives at home w/ his wife and cats   Patient is left handed.   Patient rarely drinks caffeine.      PHYSICAL EXAM  Vitals:   12/17/17 1240  BP: 127/75  Pulse: 63  Weight: 166 lb 12.8 oz (75.7 kg)  Height: 5\' 6"  (1.676 m)   Body mass index is 26.92 kg/m.   MMSE - Mini Mental State Exam 12/17/2017 06/17/2017 12/13/2016  Orientation to time 0 1 0  Orientation to Place 2 2 3   Registration 3 3 3   Attention/ Calculation 0 0 0  Recall 0 0 0  Language- name 2 objects 2 2 2   Language- repeat 0 1 1  Language- follow 3 step command 2 3 2   Language- read & follow direction 0 1 1  Write a sentence 0 0 0  Copy design 0 0 0    Total score 9 13 12      Generalized: Well developed, in no acute distress   Neurological examination  Mentation: Alert.Follows all commands-but some difficulty with comprehension.  Instructions have to be repeated twice or reworded. Speech and language fluent Cranial nerve II-XII: Pupils were equal round reactive to light. Extraocular movements were full, visual field were full on confrontational test. Facial sensation and strength were normal. Uvula tongue midline. Head turning and shoulder shrug  were normal and symmetric. Motor: The motor testing reveals 5 over 5 strength of all 4 extremities. Good symmetric motor tone is noted throughout.  Sensory: Sensory testing is intact to soft touch on all 4 extremities. No evidence of extinction is noted.  Coordination: Cerebellar testing reveals good finger-nose-finger and heel-to-shin bilaterally.  Gait and station: Gait is normal.  Reflexes: Deep tendon reflexes are symmetric and normal bilaterally.   DIAGNOSTIC DATA (LABS, IMAGING, TESTING) - I reviewed patient records, labs, notes, testing and imaging myself where available.  Lab Results  Component Value Date   WBC 5.5 01/16/2017   HGB 11.2 (L) 01/16/2017   HCT 33.1 (L) 01/16/2017   MCV 86.6 01/16/2017   PLT 235 01/16/2017      Component Value Date/Time   NA 131 (L) 01/16/2017 1324   K 3.7 01/16/2017 1324   CL 101 01/16/2017 1324   CO2 22 01/16/2017 1324   GLUCOSE 102 (H) 01/16/2017 1324   BUN 7 01/16/2017 1324   CREATININE 0.80 01/16/2017 1324   CALCIUM 8.6 (L) 01/16/2017 1324   PROT 5.8 (L) 01/16/2017 1324   ALBUMIN 3.6 01/16/2017 1324   AST 18 01/16/2017 1324  ALT 13 (L) 01/16/2017 1324   ALKPHOS 59 01/16/2017 1324   BILITOT 1.0 01/16/2017 1324   GFRNONAA >60 01/16/2017 1324   GFRAA >60 01/16/2017 1324   Lab Results  Component Value Date   CHOL 150 12/16/2015   HDL 27 (L) 12/16/2015   LDLCALC 90 12/16/2015   TRIG 165 (H) 12/16/2015   CHOLHDL 5.6 12/16/2015    Lab Results  Component Value Date   HGBA1C 7.2 (H) 12/16/2015   Lab Results  Component Value Date   VITAMINB12 946 (H) 12/15/2015   Lab Results  Component Value Date   TSH 1.159 12/15/2015      ASSESSMENT AND PLAN 72 y.o. year old male  has a past medical history of Alzheimer's disease (11/11/2014), Anemia, Bilateral carpal tunnel syndrome (01/12/2016), Colon polyp, Coronary atherosclerosis of unspecified type of vessel, native or graft, Disturbance of skin sensation, Esophageal reflux, Hypertension, Memory difficulty (04/22/2013), Obesity, unspecified, Other B-complex deficiencies, Type II or unspecified type diabetes mellitus with neurological manifestations, not stated as uncontrolled(250.60), Type II or unspecified type diabetes mellitus without mention of complication, not stated as uncontrolled, and Unspecified hereditary and idiopathic peripheral neuropathy. here with:  1.  Alzheimer's dementia  The patient's memory score has decreased slightly.  He will remain on Namenda.  He will continue on Wellbutrin and ABH cream.  I have advised that if his symptoms worsen or he develops new symptoms he should let us know.  He will follow-up in 6 months or sooner if needed.   I spent 15 minutes with the patient. 50% of this time was spent reviewing his memory for   Butch PennyMegan Ector Laurel, MSN, NP-C 12/17/2017, 12:57 PM [facility] [address], [address] [place], [place] [address] [phone]

## 2018-01-07 ENCOUNTER — Other Ambulatory Visit: Payer: Self-pay | Admitting: Adult Health

## 2018-01-07 ENCOUNTER — Other Ambulatory Visit: Payer: Self-pay

## 2018-01-07 MED ORDER — MEMANTINE HCL ER 28 MG PO CP24: 28.0000 mg | ORAL_CAPSULE | Freq: Every day | ORAL | 1 refills | Status: DC

## 2018-01-07 MED ORDER — BUPROPION HCL ER (SR) 150 MG PO TB12: 150.0000 mg | ORAL_TABLET | Freq: Every day | ORAL | 3 refills | Status: DC

## 2018-01-07 NOTE — Telephone Encounter (Signed)
Refill sent to pharmacy.   

## 2018-01-07 NOTE — Telephone Encounter (Addendum)
[facility] is requesting refill for buPROPion (WELLBUTRIN SR) 150 MG 12 hr tablet

## 2018-03-25 ENCOUNTER — Telehealth: Payer: Self-pay | Admitting: *Deleted

## 2018-03-25 NOTE — Telephone Encounter (Signed)
Faxed signed hospice order back re: "increase hospice aide freq to increased personal care need add geri sleeves for protection." Fax: [phone]. Received fax confirmation.

## 2018-03-30 ENCOUNTER — Telehealth: Payer: Self-pay | Admitting: Neurology

## 2018-03-30 NOTE — Telephone Encounter (Signed)
[facility] [place] [facility] [place] , calling for Mr Gregory RuddKennedy, 06/04/1946. He is restless , sundowning, not sleeping- wife is concerned.  Wife reported to RN that patient reacts paradox to ativan, Haldol. I recommended to try Children's benadryl 12.5 mg in PM and at night po, liquid form, see if this makes him sleepy witohut agitation.  Nurse agreed, will implement this OTC medication.   Melvyn Novasarmen Gavan Nordby, MD

## 2018-04-23 ENCOUNTER — Telehealth: Payer: Self-pay | Admitting: Adult Health

## 2018-04-23 MED ORDER — TRAZODONE HCL 50 MG PO TABS: 50.0000 mg | ORAL_TABLET | Freq: Every day | ORAL | 1 refills | Status: DC

## 2018-04-23 NOTE — Telephone Encounter (Signed)
I called and talk with the wife.  Patient is not sleeping well, we will try low-dose trazodone 50 mg at night, she will call for any dose adjustments.

## 2018-04-23 NOTE — Telephone Encounter (Signed)
Spoke to wife of pt.  He has been on numerous sleep aides which are not working.  Ambien did not owrk, ativan made him hyper, haldol made him ugly, benedryl made him relax but not sleep, ABH gel was working but now up 4-5 x night, on melatonin 5mg  nightly.  He is up 4-5 times nightly going to restroom.  No drinking after 8pm.  Please advise.

## 2018-04-23 NOTE — Telephone Encounter (Signed)
Pts wife Ann(on DPR) requesting a call stating to discuss a medication that would help the pt sleep, did not wish to discuss in detail with me. Please advise

## 2018-05-02 ENCOUNTER — Telehealth: Payer: Self-pay | Admitting: *Deleted

## 2018-05-02 NOTE — Telephone Encounter (Signed)
Faxed signed orders back to [facility], [employer] at [phone] re: d/c haldol 1mg  tab 04/30/18 and starting trazodone 50mg  tab q qhs starting 04/30/18.  Received fax confirmation.

## 2018-05-19 ENCOUNTER — Other Ambulatory Visit: Payer: Self-pay | Admitting: Neurology

## 2018-06-20 ENCOUNTER — Other Ambulatory Visit: Payer: Self-pay | Admitting: Neurology

## 2018-07-01 ENCOUNTER — Ambulatory Visit: Payer: Medicare Other | Admitting: Adult Health

## 2018-07-06 ENCOUNTER — Other Ambulatory Visit: Payer: Self-pay | Admitting: Neurology

## 2018-07-07 ENCOUNTER — Telehealth: Payer: Self-pay | Admitting: Adult Health

## 2018-07-07 MED ORDER — TRAZODONE HCL 50 MG PO TABS: 25.0000 mg | ORAL_TABLET | Freq: Every day | ORAL | 0 refills | Status: DC

## 2018-07-07 NOTE — Telephone Encounter (Signed)
Fax sent.

## 2018-07-07 NOTE — Telephone Encounter (Signed)
Called the patient's wife. She states with him being in [facility] they need the script updates to state take 1/2 tablet at bedtime. Since he has been there he has become combative and she was informed that he was taking the full tablet of trazodone while he has been there based off of the way script is written.[facility] is requiring a new script stating that the patient take 1/2 tablet. Dr Dohmeier was agreeable to filling the script for the patient. Asked the wife if I should send to [facility] and she thinks yes. She will call them and find out for sure if I send the script to them to be filled and get their fax number if so.  Wife will call back to advise where I send the script please inform her I will send to the number she provides.

## 2018-07-07 NOTE — Telephone Encounter (Signed)
Patient's wife calling back stating Trazodone needs to be faxed to [facility] at [phone].

## 2018-07-07 NOTE — Telephone Encounter (Signed)
Pts wife [place]ne [place] requesting a call stating due to her recent surgery [facility]. Stating that he isn't letting the staff near him, "tearing up his arms", not in the right head space. Requesting a call to discuss further and sending an updated rx for traZODone (DESYREL) 50 MG tablet only 25MG  please advise

## 2018-07-23 ENCOUNTER — Telehealth: Payer: Self-pay | Admitting: Neurology

## 2018-07-23 NOTE — Telephone Encounter (Signed)
I contacted the pt's wife and was able to schedule work in visit for 08/01/18 at 12 pm check in time of 11:30 am. Wife states she will call back to reschedule visit if there is a conflict with transportation.

## 2018-07-23 NOTE — Telephone Encounter (Signed)
Pts wife was calling because she is concerned of her husband having episodes of lashing out, being disoriented and "being ugly", paranoid and he is tearing his arms up. She would like to know what she can do about the way he is reacting. Please advise.

## 2018-07-23 NOTE — Telephone Encounter (Signed)
I called the wife.  The wife broke her hip around Thanksgiving, the husband went into [facility] in [place], [place].  He remains there at this point, the plans are to keep him there.  The patient is had increasing problems with agitation that may come and go during the day.  It is not clear whether or not he is having hallucinations.  He is on Wellbutrin, he takes some trazodone at night for sleep.  In the past he has not tolerated Seroquel or Lexapro.  We will try to get a revisit for him to devise a regimen that may help him rest better at night and reduce agitation during the day.  According to the wife, they are to have a psychiatric evaluation to review medications for this purpose as well.

## 2018-08-01 ENCOUNTER — Encounter: Payer: Self-pay | Admitting: Neurology

## 2018-08-01 ENCOUNTER — Ambulatory Visit (INDEPENDENT_AMBULATORY_CARE_PROVIDER_SITE_OTHER): Admitting: Neurology

## 2018-08-01 VITALS — BP 157/71 | HR 71 | Ht 66.0 in | Wt 161.3 lb

## 2018-08-01 DIAGNOSIS — G309 Alzheimer's disease, unspecified: Secondary | ICD-10-CM

## 2018-08-01 DIAGNOSIS — F0280 Dementia in other diseases classified elsewhere without behavioral disturbance: Secondary | ICD-10-CM | POA: Diagnosis not present

## 2018-08-01 MED ORDER — TRAZODONE HCL 50 MG PO TABS: 25.0000 mg | ORAL_TABLET | Freq: Every day | ORAL | 1 refills | Status: DC

## 2018-08-01 NOTE — Progress Notes (Signed)
Reason for visit: Senile dementia of Alzheimer's type  Gregory Ortega is an 73 y.o. male  History of present illness:  Gregory Ortega is a 73 year old left-handed white male with a history of Alzheimer's disease with ongoing progression of memory issues.  Gregory Ortega currently is residing in a memory disorders unit, he is at [facility].  Gregory Ortega has had significant problems with agitation.  He recently had a psychiatric nurse come in Gregory reviewed Gregory medications Gregory made alterations.  Gregory Wellbutrin dose was reduced from 150 mg to 75 mg daily, he remains on Depakote 125 mg sprinkles twice daily.  He has been placed on Prozac 10 mg daily Gregory was taken off of melatonin.  He remains on Namenda 28 mg capsule daily.  He is still on Seroquel 25 mg at night.  Gregory trazodone is reduced to 25 mg at night for 2 weeks Gregory then is to be stopped.  Gregory Ortega returns to Gregory office today for an evaluation.  Gregory Ortega is not sleeping well, he is up Gregory down at night.  He still recognizes Gregory Ortega Gregory Ortega.  He currently is being followed through hospice.  He comes to this office for an evaluation.  Gregory Ortega requires assistance with bathing, dressing, Gregory even feeding, he has lost ability to know how to use a spoon or fork.  They take him to Gregory bathroom on a scheduled basis.  Past Medical History:  Diagnosis Date  . Alzheimer's disease (HCC) 11/11/2014  . Anemia   . Bilateral carpal tunnel syndrome 01/12/2016  . Colon polyp   . Coronary atherosclerosis of unspecified type of vessel, native or graft   . Disturbance of skin sensation   . Esophageal reflux   . Hypertension   . Memory difficulty 04/22/2013  . Obesity, unspecified   . Other B-complex deficiencies   . Type II or unspecified type diabetes mellitus with neurological manifestations, not stated as uncontrolled(250.60)   . Type II or unspecified type diabetes mellitus without mention of complication, not stated as  uncontrolled   . Unspecified hereditary Gregory idiopathic peripheral neuropathy     Past Surgical History:  Procedure Laterality Date  . BY-PASS SURGERY  2005  . COLONOSCOPY    . COLONOSCOPY N/A 11/09/2016   Procedure: COLONOSCOPY;  Surgeon: Carman Ching, MD;  Location: [facility];  Service: Endoscopy;  Laterality: N/A;  . CORONARY STENT PLACEMENT  2017  . IR GENERIC HISTORICAL  07/27/2016   IR ANGIO INTRA EXTRACRAN SEL COM CAROTID INNOMINATE BILAT MOD SED 07/27/2016 Julieanne Cotton, MD [facility]  . IR GENERIC HISTORICAL  07/27/2016   IR ANGIO VERTEBRAL SEL SUBCLAVIAN INNOMINATE UNI R MOD SED 07/27/2016 Julieanne Cotton, MD [facility]  . IR GENERIC HISTORICAL  07/27/2016   IR ANGIO VERTEBRAL SEL VERTEBRAL UNI L MOD SED 07/27/2016 Julieanne Cotton, MD [facility]  . MOLES REMOVAL  2013    Family History  Problem Relation Age of Onset  . Hypertension Mother   . Alzheimer's disease Mother   . Dementia Mother   . Cancer Brother   . Heart attack Brother   . Kidney disease Father   . Alzheimer's disease Father   . Rheum arthritis Sister     Social history:  reports that he has quit smoking. He has never used smokeless tobacco. He reports that he does not drink alcohol or use drugs.    Allergies  Allergen Reactions  . Ciprofloxacin Other (See Comments)    ?  thrush  . Statins Other (See Comments)    Confusion, memory loss Gregory couldn't concentrate   . Ambien [Zolpidem Tartrate] Other (See Comments)    irritability  . Brilinta [Ticagrelor]   . Effient [Prasugrel Hcl]     " bleeds too much"  . Lexapro [Escitalopram Oxalate]     Muscle pain  . Rosuvastatin Calcium Other (See Comments)    Confusion, memory loss Gregory couldn't concentrate   . Seroquel [Quetiapine Fumarate] Nausea Only    irritability  . Zetia [Ezetimibe] Other (See Comments)    Blood sugar too high  . Zithromax [Azithromycin] Diarrhea    Medications:  Prior to Admission medications   Medication Sig  Start Date End Date Taking? Authorizing Provider  aspirin EC 81 MG tablet Take 81 mg by mouth every morning.   Yes [provider]  atorvastatin (LIPITOR) 10 MG tablet Take 5 mg by mouth at bedtime.   Yes [provider]  buPROPion (WELLBUTRIN SR) 150 MG 12 hr tablet TAKE ONE TABLET BY MOUTH EVERY DAY 05/19/18  Yes Butch PennyMillikan, Megan, NP  clopidogrel (PLAVIX) 75 MG tablet Take 75 mg by mouth daily.   Yes [provider]  co-enzyme Q-10 50 MG capsule Take 200 mg by mouth every morning.    Yes [provider]  divalproex (DEPAKOTE) 125 MG DR tablet Take 125 mg by mouth 2 (two) times daily.   Yes [provider]  docusate sodium (COLACE) 100 MG capsule Take 1 capsule (100 mg total) by mouth every 12 (twelve) hours. 01/16/17  Yes Long, Arlyss RepressJoshua G, MD  fexofenadine (ALLEGRA) 180 MG tablet Take 180 mg by mouth daily as needed for allergies.    Yes [provider]  FLUoxetine (PROZAC) 10 MG tablet Take 10 mg by mouth daily.   Yes [provider]  Melatonin 5 MG CAPS Take 1 capsule by mouth at bedtime. 12/11/17  Yes [provider]  NAMENDA XR 28 MG CP24 24 hr capsule TAKE ONE CAPSULE BY MOUTH EVERY DAY 07/07/18  Yes Butch PennyMillikan, Megan, NP  nitroGLYCERIN (NITROSTAT) 0.4 MG SL tablet Place 0.4 mg under Gregory tongue every 5 (five) minutes as needed for chest pain.   Yes [provider]  Omega-3 Fatty Acids (SALMON OIL-1000) 200 MG CAPS Take 1 capsule by mouth 2 (two) times daily.    Yes [provider]  omeprazole (PRILOSEC) 20 MG capsule Take 20 mg by mouth every morning.    Yes [provider]  tamsulosin (FLOMAX) 0.4 MG CAPS capsule Take 0.4 mg by mouth.   Yes [provider]  traZODone (DESYREL) 50 MG tablet TAKE 1 TABLET BY MOUTH AT BEDTIME 06/20/18  Yes York SpanielWillis, Charles K, MD  vitamin B-12 (CYANOCOBALAMIN) 1000 MCG tablet Take 1,000 mcg by mouth daily.   Yes [provider]  vitamin E 400 UNIT capsule  Take 400 Units by mouth daily.   Yes [provider]    ROS:  Out of a complete 14 system review of symptoms, Gregory Ortega complains only of Gregory following symptoms, Gregory all other reviewed systems are negative.  Cold intolerance Leg swelling Insomnia, daytime sleepiness Food allergies Difficulty urinating Memory loss, speech difficulty Agitation, anxiety, self injury  Blood pressure (!) 157/71, pulse 71, height 5\' 6"  (1.676 m), weight 161 lb 5 oz (73.2 kg).  Physical Exam  General: Gregory Ortega is alert Gregory cooperative at Gregory time of Gregory examination.  Skin: 2+ edema below Gregory knees is seen bilaterally..   Neurologic  Exam  Mental status: Gregory Ortega is alert Gregory calm, he will cooperate.  Minimally verbal. Gregory Mini-Mental status examination done today shows a total score of 3/30.   Cranial nerves: Facial symmetry is present. Speech is normal, but Gregory Ortega is minimally verbal.. Extraocular movements are full. Visual fields are full to threat.  Motor: Gregory Ortega has good strength in all 4 extremities.  Sensory examination: Soft touch sensation is difficult to discern, Gregory Ortega does not respond appropriately.  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  Gait Gregory station: Gregory Ortega has a normal gait.  Reflexes: Deep tendon reflexes are symmetric.   Assessment/Plan:  1.  Alzheimer's disease  Gregory Ortega is having some agitation as Gregory Alzheimer's disease progresses.  Gregory Ortega has had recent changes within Gregory last 2 days with Gregory medications as delineated above.  If agitation continues, Gregory valproic acid can be increased.  If he does not sleep well at night, Gregory trazodone can be reinstituted at night at a dose higher than 50 mg.  Gregory Ortega otherwise will follow-up in 6 months.  Gregory Ortega will call me in about 3 weeks if he is not doing well.  Marlan Palau MD 08/01/2018 1:48 PM  [facility] [address] [place], [place] [address]  Phone [phone] Fax [phone]

## 2018-08-14 ENCOUNTER — Telehealth: Payer: Self-pay | Admitting: Neurology

## 2018-08-14 MED ORDER — RISPERIDONE 0.5 MG PO TABS: 0.5000 mg | ORAL_TABLET | Freq: Every day | ORAL | 1 refills | Status: AC

## 2018-08-14 NOTE — Addendum Note (Signed)
Addended by: York Spaniel on: 08/14/2018 05:42 PM   Modules accepted: Orders

## 2018-08-14 NOTE — Telephone Encounter (Signed)
Pt wife(on [place]) states she was told to call after 3 weeks to give update.  Last night pt had hallucinations, wife had to stay with him from 730-10 to help calm him.  Wife was the only one able to calm him.  Wife states [employer] took pt off of traZODone (DESYREL) 50 MG tablet.  Wife states she was told he was fine this morning. Wife is asking for a call to discuss medication for hallucinations

## 2018-08-14 NOTE — Telephone Encounter (Signed)
I called the wife.  The patient had increased confusion on the trazodone.  This has been discontinued, Seroquel has been restarted with the patient in the past has not tolerated this medication either.  I will send a fax to discontinue the Seroquel, start Risperdal 0.5 mg at night.    Fax [facility] at [phone].

## 2018-08-18 ENCOUNTER — Telehealth: Payer: Self-pay | Admitting: Neurology

## 2018-08-18 NOTE — Telephone Encounter (Signed)
I called and talk with the hospice nurse.  The patient has had severe episodes of sundowning and extreme agitation, physically aggressive towards nursing staff, punching walls, damaging property inside of his room and in the bathroom.  I am concerned that the morphine may increase confusion, but nothing else has helped, okay to try the medication, but should be discontinued abruptly if evidence of increased agitation.

## 2018-08-18 NOTE — Telephone Encounter (Signed)
[facility] [phone] is wanting to know if it is ok for a script for morphine to be written by NP at the facility. Please call to discuss

## 2019-01-14 DEATH — deceased

## 2019-02-02 ENCOUNTER — Ambulatory Visit: Payer: BC Managed Care – PPO | Admitting: Neurology
